# Patient Record
Sex: Male | Born: 1935 | Race: White | Hispanic: No | Marital: Married | Smoking: Never smoker
Health system: Southern US, Community
[De-identification: ages and names within clinical notes are randomized; demographics above are authoritative.]

## PROBLEM LIST (undated history)

## (undated) DIAGNOSIS — I10 Essential (primary) hypertension: Secondary | ICD-10-CM

## (undated) DIAGNOSIS — E785 Hyperlipidemia, unspecified: Secondary | ICD-10-CM

## (undated) DIAGNOSIS — I509 Heart failure, unspecified: Secondary | ICD-10-CM

## (undated) DIAGNOSIS — N400 Enlarged prostate without lower urinary tract symptoms: Secondary | ICD-10-CM

## (undated) DIAGNOSIS — T8859XA Other complications of anesthesia, initial encounter: Secondary | ICD-10-CM

## (undated) HISTORY — PX: CORONARY ANGIOPLASTY WITH STENT PLACEMENT: SHX49

## (undated) HISTORY — PX: PROSTATE ABLATION: SHX6042

## (undated) HISTORY — PX: HERNIA REPAIR: SHX51

---

## 2013-05-02 DIAGNOSIS — N401 Enlarged prostate with lower urinary tract symptoms: Secondary | ICD-10-CM | POA: Insufficient documentation

## 2013-06-03 ENCOUNTER — Ambulatory Visit: Payer: Self-pay | Admitting: Urology

## 2013-06-03 LAB — BASIC METABOLIC PANEL
BUN: 17 mg/dL (ref 7–18)
Co2: 26 mmol/L (ref 21–32)
EGFR (Non-African Amer.): 48 — ABNORMAL LOW
Glucose: 93 mg/dL (ref 65–99)
Osmolality: 269 (ref 275–301)
Sodium: 134 mmol/L — ABNORMAL LOW (ref 136–145)

## 2013-06-03 LAB — CBC WITH DIFFERENTIAL/PLATELET
Comment - H1-Com1: NORMAL
Eosinophil: 3 %
HCT: 41.1 % (ref 40.0–52.0)
HGB: 14.7 g/dL (ref 13.0–18.0)
Lymphocytes: 12 %
Monocytes: 5 %
Platelet: 238 10*3/uL (ref 150–440)
RBC: 4.38 10*6/uL — ABNORMAL LOW (ref 4.40–5.90)
RDW: 13.9 % (ref 11.5–14.5)
Segmented Neutrophils: 80 %
WBC: 9.2 10*3/uL (ref 3.8–10.6)

## 2013-06-12 ENCOUNTER — Ambulatory Visit: Payer: Self-pay | Admitting: Urology

## 2013-06-13 LAB — HEMOGLOBIN: HGB: 13.4 g/dL (ref 13.0–18.0)

## 2013-07-12 DIAGNOSIS — R350 Frequency of micturition: Secondary | ICD-10-CM | POA: Insufficient documentation

## 2014-11-14 NOTE — Op Note (Signed)
PATIENT NAME:  James Mata, James Mata MR#:  [id number] DATE OF BIRTH:  10/13/1930  DATE OF PROCEDURE:  06/12/2013  PREOPERATIVE DIAGNOSES:  1.  Benign prostatic hypertrophy with lower urinary tract symptoms.  2.  Urinary retention secondary to above.   POSTOPERATIVE DIAGNOSES: 1.  Benign prostatic hypertrophy with lower urinary tract symptoms.  2.  Urinary retention secondary to above.   PROCEDURE: Photoselective vaporization of the prostate.   SURGEON: Irineo AxonScott Stoioff, MD   ASSISTANT: None.   ANESTHESIA: General.   INDICATIONS: This is an 81 year old male with acute urinary retention. He has failed voiding trials x2. Cystoscopy demonstrated lateral lobe enlargement without significant bladder neck elevation or median lobe. After discussion of options, he elected photoselective vaporization with a green light laser.  DESCRIPTION OF PROCEDURE: The patient was taken to the operating room and placed on the cystoscopy table in the supine position. A general anesthetic was administered by anesthesia. He was then placed in the low lithotomy position and his external genitalia were prepped and draped in the usual fashion. A timeout was performed per protocol with all in agreement. A 22.5-French continuous flow laser cystoscope was lubricated and passed under direct vision. The lateral lobes were touching and the anterior and posterior dimensions of the prostate were increased. There was no significant bladder neck elevation or median lobe tissue present. There were inflammatory changes of the trigone secondary to the indwelling Foley catheter. No papillary or solid appearing lesions were noted. A Greenlight XPS laser fiber was placed through the cystoscope. Attention was directed to the right lateral lobe and vaporization was commenced at a power of 80 watts beginning at the bladder neck and working toward the verumontanum. There was a moderate amount of oozing noted throughout the procedure necessitating  intermittent laser coagulation. Power was increased to 120 watts. Once right lateral lobe was vaporized, the oozing was impeding visualization. It was elected at this point to convert to electrosurgical vaporization. The cystoscope was removed. A 27-French continuous-flow resectoscope sheath with visual obturator was placed without difficulty. The Iglesias resectoscope with button electrode was then placed into the sheath. The bladder neck region and right lateral lobe area was contoured. The left lateral lobe was then vaporized from bladder neck to verumontanum. Hemostasis was obtained with cautery. At the completion of the procedure, there was a moderate amount of oozing present with no bleeders noted. This was impeding visualization and it was elected to terminate the procedure. Vaporization was not complete. A 20-French Foley catheter was placed with moderate bloody effluent upon irrigation. This catheter was removed and a 20-French three-way Foley catheter was placed to CBI. A B and O suppository was placed per rectum. He was taken to PAC-U in stable condition. EBL 100 mL. ____________________________ Verna CzechScott C. Lonna CobbStoioff, MD scs:sb D: 06/13/2013 08:08:23 ET T: 06/13/2013 08:25:28 ET JOB#: [id number]  cc: Lorin PicketScott C. Lonna CobbStoioff, MD, <Dictator> Riki AltesSCOTT C STOIOFF MD ELECTRONICALLY SIGNED 06/30/2013 17:09

## 2020-02-14 ENCOUNTER — Emergency Department
Admission: EM | Admit: 2020-02-14 | Discharge: 2020-02-14 | Disposition: A | Discharge status: Home / Self Care | Payer: Medicare PPO | Attending: Emergency Medicine | Admitting: Emergency Medicine

## 2020-02-14 ENCOUNTER — Other Ambulatory Visit: Payer: Self-pay

## 2020-02-14 ENCOUNTER — Telehealth: Payer: Self-pay | Admitting: Urology

## 2020-02-14 DIAGNOSIS — N390 Urinary tract infection, site not specified: Secondary | ICD-10-CM | POA: Diagnosis not present

## 2020-02-14 DIAGNOSIS — R339 Retention of urine, unspecified: Secondary | ICD-10-CM | POA: Diagnosis present

## 2020-02-14 DIAGNOSIS — I509 Heart failure, unspecified: Secondary | ICD-10-CM | POA: Insufficient documentation

## 2020-02-14 DIAGNOSIS — I110 Hypertensive heart disease with heart failure: Secondary | ICD-10-CM | POA: Insufficient documentation

## 2020-02-14 DIAGNOSIS — R338 Other retention of urine: Secondary | ICD-10-CM | POA: Diagnosis not present

## 2020-02-14 DIAGNOSIS — N320 Bladder-neck obstruction: Secondary | ICD-10-CM

## 2020-02-14 HISTORY — DX: Essential (primary) hypertension: I10

## 2020-02-14 HISTORY — DX: Benign prostatic hyperplasia without lower urinary tract symptoms: N40.0

## 2020-02-14 HISTORY — DX: Hyperlipidemia, unspecified: E78.5

## 2020-02-14 HISTORY — DX: Heart failure, unspecified: I50.9

## 2020-02-14 LAB — URINALYSIS, COMPLETE (UACMP) WITH MICROSCOPIC
Bacteria, UA: NONE SEEN
RBC / HPF: >50 RBC/hpf — ABNORMAL HIGH (ref 0–5)
Specific Gravity, Urine: 1.011 (ref 1.005–1.030)
Squamous Epithelial / LPF: NONE SEEN (ref 0–5)
WBC, UA: >50 WBC/hpf — ABNORMAL HIGH (ref 0–5)

## 2020-02-14 MED ORDER — HYDROCODONE-ACETAMINOPHEN 5-325 MG PO TABS: 2.0000 | ORAL_TABLET | Freq: Once | ORAL | Status: DC

## 2020-02-14 MED ORDER — CEPHALEXIN 500 MG PO CAPS
500.0000 mg | ORAL_CAPSULE | Freq: Once | ORAL | Status: AC
  Administered 2020-02-14: 500 mg via ORAL
  Filled 2020-02-14: qty 1

## 2020-02-14 NOTE — ED Notes (Signed)
Urologist at bedside.

## 2020-02-14 NOTE — Telephone Encounter (Signed)
-----   Message from Jerilee Field, MD sent at 02/14/2020  1:09 PM EDT ----- He needs f/u with Dr. Lonna Cobb or PA to plan foley catheter change in 1-2 weeks. S/p bladder neck dilation and foley placement in ED 7/23. Thanks.

## 2020-02-14 NOTE — Telephone Encounter (Signed)
App made 

## 2020-02-14 NOTE — ED Notes (Signed)
Dr Lenard Lance at bedside with a coude catheter. Unable to place, urology cart called for

## 2020-02-14 NOTE — ED Notes (Signed)
This RN attempted to place a foley without success, Dr Lenard Lance aware

## 2020-02-14 NOTE — Discharge Instructions (Addendum)
Indwelling Urinary Catheter Care, Adult An indwelling urinary catheter is a thin tube that is put into your bladder. The tube helps to drain pee (urine) out of your body. The tube goes in through your urethra. Your urethra is where pee comes out of your body. Your pee will come out through the catheter, then it will go into a bag (drainage bag). Take good care of your catheter so it will work well. How to wear your catheter and bag Supplies needed  Sticky tape (adhesive tape) or a leg strap.  Alcohol wipe or soap and water (if you use tape).  A clean towel (if you use tape).  Large overnight bag.  Smaller bag (leg bag). Wearing your catheter Attach your catheter to your leg with tape or a leg strap.  Make sure the catheter is not pulled tight.  If a leg strap gets wet, take it off and put on a dry strap.  If you use tape to hold the bag on your leg: 1. Use an alcohol wipe or soap and water to wash your skin where the tape made it sticky before. 2. Use a clean towel to pat-dry that skin. 3. Use new tape to make the bag stay on your leg. Wearing your bags You should have been given a large overnight bag.  You may wear the overnight bag in the day or night.  Always have the overnight bag lower than your bladder.  Do not let the bag touch the floor.  Before you go to sleep, put a clean plastic bag in a wastebasket. Then hang the overnight bag inside the wastebasket. You should also have a smaller leg bag that fits under your clothes.  Always wear the leg bag below your knee.  Do not wear your leg bag at night. How to care for your skin and catheter Supplies needed  A clean washcloth.  Water and mild soap.  A clean towel. Caring for your skin and catheter      Clean the skin around your catheter every day: 1. Wash your hands with soap and water. 2. Wet a clean washcloth in warm water and mild soap. 3. Clean the skin around your urethra.  If you are  male:  Gently spread the folds of skin around your vagina (labia).  With the washcloth in your other hand, wipe the inner side of your labia on each side. Wipe from front to back.  If you are male:  Pull back any skin that covers the end of your penis (foreskin).  With the washcloth in your other hand, wipe your penis in small circles. Start wiping at the tip of your penis, then move away from the catheter.  Move the foreskin back in place, if needed. 4. With your free hand, hold the catheter close to where it goes into your body.  Keep holding the catheter during cleaning so it does not get pulled out. 5. With the washcloth in your other hand, clean the catheter.  Only wipe downward on the catheter.  Do not wipe upward toward your body. Doing this may push germs into your urethra and cause infection. 6. Use a clean towel to pat-dry the catheter and the skin around it. Make sure to wipe off all soap. 7. Wash your hands with soap and water.  Shower every day. Do not take baths.  Do not use cream, ointment, or lotion on the area where the catheter goes into your body, unless your doctor tells you   to.  Do not use powders, sprays, or lotions on your genital area.  Check your skin around the catheter every day for signs of infection. Check for: ? Redness, swelling, or pain. ? Fluid or blood. ? Warmth. ? Pus or a bad smell. How to empty the bag Supplies needed  Rubbing alcohol.  Gauze pad or cotton ball.  Tape or a leg strap. Emptying the bag Pour the pee out of your bag when it is ?- full, or at least 2-3 times a day. Do this for your overnight bag and your leg bag. 1. Wash your hands with soap and water. 2. Separate (detach) the bag from your leg. 3. Hold the bag over the toilet or a clean pail. Keep the bag lower than your hips and bladder. This is so the pee (urine) does not go back into the tube. 4. Open the pour spout. It is at the bottom of the bag. 5. Empty the  pee into the toilet or pail. Do not let the pour spout touch any surface. 6. Put rubbing alcohol on a gauze pad or cotton ball. 7. Use the gauze pad or cotton ball to clean the pour spout. 8. Close the pour spout. 9. Attach the bag to your leg with tape or a leg strap. 10. Wash your hands with soap and water. Follow instructions for cleaning the drainage bag:  From the product maker.  As told by your doctor. How to change the bag Supplies needed  Alcohol wipes.  A clean bag.  Tape or a leg strap. Changing the bag Replace your bag when it starts to leak, smell bad, or look dirty. 1. Wash your hands with soap and water. 2. Separate the dirty bag from your leg. 3. Pinch the catheter with your fingers so that pee does not spill out. 4. Separate the catheter tube from the bag tube where these tubes connect (at the connection valve). Do not let the tubes touch any surface. 5. Clean the end of the catheter tube with an alcohol wipe. Use a different alcohol wipe to clean the end of the bag tube. 6. Connect the catheter tube to the tube of the clean bag. 7. Attach the clean bag to your leg with tape or a leg strap. Do not make the bag tight on your leg. 8. Wash your hands with soap and water. General rules   Never pull on your catheter. Never try to take it out. Doing that can hurt you.  Always wash your hands before and after you touch your catheter or bag. Use a mild, fragrance-free soap. If you do not have soap and water, use hand sanitizer.  Always make sure there are no twists or bends (kinks) in the catheter tube.  Always make sure there are no leaks in the catheter or bag.  Drink enough fluid to keep your pee pale yellow.  Do not take baths, swim, or use a hot tub.  If you are male, wipe from front to back after you poop (have a bowel movement). Contact a doctor if:  Your pee is cloudy.  Your pee smells worse than usual.  Your catheter gets clogged.  Your catheter  leaks.  Your bladder feels full. Get help right away if:  You have redness, swelling, or pain where the catheter goes into your body.  You have fluid, blood, pus, or a bad smell coming from the area where the catheter goes into your body.  Your skin feels warm where   the catheter goes into your body.  You have a fever.  You have pain in your: ? Belly (abdomen). ? Legs. ? Lower back. ? Bladder.  You see blood in the catheter.  Your pee is pink or red.  You feel sick to your stomach (nauseous).  You throw up (vomit).  You have chills.  Your pee is not draining into the bag.  Your catheter gets pulled out. Summary  An indwelling urinary catheter is a thin tube that is placed into the bladder to help drain pee (urine) out of the body.  The catheter is placed into the part of the body that drains pee from the bladder (urethra).  Taking good care of your catheter will keep it working properly and help prevent problems.  Always wash your hands before and after touching your catheter or bag.  Never pull on your catheter or try to take it out. This information is not intended to replace advice given to you by your health care provider. Make sure you discuss any questions you have with your health care provider. Document Revised: 11/02/2018 Document Reviewed: 02/24/2017 Elsevier Patient Education  2020 [employer]  

## 2020-02-14 NOTE — ED Triage Notes (Signed)
Pt states he saw his PCP yesterday for a UTI and started abx, states he has only been able to pass small amounts of urine since last night and feels like his bladder is full. States he has a hx of urinary retention.

## 2020-02-14 NOTE — ED Notes (Signed)
Pt arrives to the ER c/o being unable to void, states that it started last pm, states that his last foley he had placed was in 2014. Pt showed greater than a 1000 ml's on bladder scan. Pt reports that he was told he had 3 different bacteria in his urine yesterday

## 2020-02-14 NOTE — ED Notes (Signed)
Bladder scan results = ">999 ml".

## 2020-02-14 NOTE — ED Provider Notes (Signed)
[facility] Emergency Department Provider Note  Time seen: 10:14 AM  I have reviewed the triage vital signs and the nursing notes.   HISTORY  Chief Complaint Urinary Retention   HPI James Mata is a 86 y.o. male with a past medical history of CHF, BPH, hypertension, hyperlipidemia presents to the emergency department with inability to urinate.  According to the patient he was diagnosed with urinary tract infection yesterday his doctor and placed on antibiotics and given a shot of antibiotics per patient.  States he was doing well yesterday until overnight when he began urinating very little and then this morning is not been able to urinate and has had increasing distention and discomfort in the lower abdomen.  Patient denies any fever denies any nausea or vomiting.  Appears well currently.   Past Medical History:  Diagnosis Date  . CHF (congestive heart failure) (HCC)   . Enlarged prostate   . Hyperlipidemia   . Hypertension     There are no problems to display for this patient.   Prior to Admission medications   Not on File    No Known Allergies  No family history on file.  Social History Social History   Tobacco Use  . Smoking status: Never Smoker  . Smokeless tobacco: Never Used  Substance Use Topics  . Alcohol use: Not Currently  . Drug use: Not Currently    Review of Systems Constitutional: Negative for fever. Cardiovascular: Negative for chest pain. Respiratory: Negative for shortness of breath. Gastrointestinal: Positive for lower abdominal discomfort/fullness. Genitourinary: Inability to urinate this morning. Musculoskeletal: Negative for musculoskeletal complaints Neurological: Negative for headache All other ROS negative  ____________________________________________   PHYSICAL EXAM:  VITAL SIGNS: ED Triage Vitals  Enc Vitals Group     BP 02/14/20 0903 (!) 186/96     Pulse Rate 02/14/20 0903 101     Resp 02/14/20  0903 17     Temp 02/14/20 0903 98 F (36.7 C)     Temp Source 02/14/20 0903 Oral     SpO2 02/14/20 0903 95 %     Weight 02/14/20 0905 170 lb (77.1 kg)     Height 02/14/20 0905 5\' 7"  (1.702 m)     Head Circumference --      Peak Flow --      Pain Score 02/14/20 0905 7     Pain Loc --      Pain Edu? --      Excl. in GC? --    Constitutional: Alert and oriented. Well appearing and in no distress. Eyes: Normal exam ENT      Head: Normocephalic and atraumatic.      Mouth/Throat: Mucous membranes are moist. Cardiovascular: Normal rate, regular rhythm. No murmur Respiratory: Normal respiratory effort without tachypnea nor retractions. Breath sounds are clear  Gastrointestinal: Fullness to the lower abdomen consistent with bladder distention with mild tenderness to this area.  Abdomen otherwise benign. Musculoskeletal: Nontender with normal range of motion in all extremities.  Neurologic:  Normal speech and language. No gross focal neurologic deficits  Skin:  Skin is warm, dry and intact.  Psychiatric: Mood and affect are normal.   ____________________________________________    INITIAL IMPRESSION / ASSESSMENT AND PLAN / ED COURSE  Pertinent labs & imaging results that were available during my care of the patient were reviewed by me and considered in my medical decision making (see chart for details).   Patient presents to the emergency department for lower abdominal  discomfort/fullness along with inability to urinate.  Patient was diagnosed with urinary tract infection yesterday by his PCP and placed on antibiotics.  Has not been able to urinate this morning.  Patient has a distended lower abdomen.  Bladder scan shows greater than 1000 mL.  We will place a Foley catheter and send the urine for urinalysis and urine culture.  Unable to pass the catheter x2.  I used a coud catheter, unable to pass a coud catheter.  We will discussed with urology for further recommendations.  Urology  was able to pass a wire and dilate to pass a catheter.  Catheter is now in place and draining correctly.  Urology is checked on the patient approximate hour after catheter placement continues to drain properly.  Urinalysis does show blood and white cells.  Patient is currently taking antibiotics.  Patient will follow up with Dr. Lonna Cobb in the office.  I discussed return precautions.  Patient agreeable to plan of care.  James Mata was evaluated in Emergency Department on 02/14/2020 for the symptoms described in the history of present illness. He was evaluated in the context of the global COVID-19 pandemic, which necessitated consideration that the patient might be at risk for infection with the SARS-CoV-2 virus that causes COVID-19. Institutional protocols and algorithms that pertain to the evaluation of patients at risk for COVID-19 are in a state of rapid change based on information released by regulatory bodies including the CDC and federal and state organizations. These policies and algorithms were followed during the patient's care in the ED.  ____________________________________________   FINAL CLINICAL IMPRESSION(S) / ED DIAGNOSES  Urinary tract infection Urinary retention   Minna Antis, MD 02/14/20 1404

## 2020-02-14 NOTE — Consult Note (Signed)
Consultation: Urinary retention, difficult Foley Requested by: Dr. Minna Antis  History of Present Illness: [name] is an 86 year old male with a history of BPH.  He underwent greenlight photo vaporization of the prostate with Dr. Lonna Cobb in 2014.  He has had progressive difficulty voiding and weak stream over the past few months until today when he could no longer void.  Bladder scan greater than a liter.  Nurses tried to catheters and a coud catheter with no success.  Urology consulted.  Past Medical History:  Diagnosis Date  . CHF (congestive heart failure) (HCC)   . Enlarged prostate   . Hyperlipidemia   . Hypertension      Home Medications:  (Not in a hospital admission)  Allergies: No Known Allergies  No family history on file. Social History:  reports that he has never smoked. He has never used smokeless tobacco. He reports previous alcohol use. He reports previous drug use.  ROS: A complete review of systems was performed.  All systems are negative except for pertinent findings as noted. Review of Systems  All other systems reviewed and are negative.    Physical Exam:  Vital signs in last 24 hours: Temp:  [98 F (36.7 C)] 98 F (36.7 C) (07/23 0903) Pulse Rate:  [90-101] 90 (07/23 1200) Resp:  [17] 17 (07/23 0903) BP: (176-186)/(89-96) 176/89 (07/23 1200) SpO2:  [93 %-95 %] 93 % (07/23 1200) Weight:  [77.1 kg] 77.1 kg (07/23 0905) General:  Alert and oriented, No acute distress HEENT: Normocephalic, atraumatic Cardiovascular: Regular rate and rhythm Lungs: Regular rate and effort Abdomen: Soft, nontender, nondistended, no abdominal masses, umbilical hernia, distended bladder. Back: No CVA tenderness Extremities: + edema Neurologic: Grossly intact GU: Penis circumcised and without mass or lesion.  Glans and meatus appeared normal.  Blood per meatus present.   Procedure: I discussed with the patient the nature risk benefits of Foley catheter placement  and he elected to proceed.  He was prepped and draped in usual sterile fashion and I passed an 90 [place] coud catheter which coiled in the prostate.  No drainage.  I discussed with the patient the nature risk benefits of flexible cystoscopy which is similar to Foley catheter placement although we discussed he may need passage of a wire and dilation of any stricture.  All questions answered.  He elected to proceed.  The flexible cystoscope was passed per urethra and made it all the way past the veru into the prostate when there was a slightly dilated prostatic urethra where the catheters need To nephrectomy have been coiling.  I could follow clear mucosa anteriorly and followed that up had a steep angle toward the bladder and found a 8 [place] hole to pierce through which I believed looked into the bladder.  A sensor wire was advanced without difficulty and it felt the coil normally.  I then backed the scope out and use the urethral dilators.  Initially I passed a 12 which would not go and then switched down to the 8.  He was dilated from 8-12 [place].  The 14 French catheter then buckled but with some tension on the wire and then passed.  I passed the cystoscope along the wire but could not see well enough to follow the wire into the bladder or get the scope to pass into the bladder.  I then backed the scope out and then passed the 16 [place] dilator which would not pass.  Therefore I switched back to the 14 [place] and  was able to get it to pass.  I would add removing the wire allowed brisk drainage of clear urine confirming bladder access.  The wire was replaced and the catheter removed.  The smallest council tip catheter had was a 38 [place] and I tried to pass it and it would not pass.  Therefore I passed the 12 and the 14 back into the bladder.  I remove the wire again there was brisk drainage.  Swapped out to an Amplatz superstiff and then tried to repassed the 16 [place] dilator which still would not pass.  I  tried the 45 [place] council tip 1 more time and with some steady back pressure on the wire the 16 [place] council tip slipped into the bladder.  The balloon was inflated and then it was seated at the bladder neck and drained light red urine.  There were no clots.  Laboratory Data:  No results found for this or any previous visit (from the past 24 hour(s)). No results found for this or any previous visit (from the past 240 hour(s)). Creatinine: No results for input(s): CREATININE in the last 168 hours.  Impression/Assessment/plan:  Bladder neck contracture-continue Foley catheter.  I will have him follow-up with Dr. Lonna Cobb in a week or 2 to plan either catheter change in the office where cystoscopy is available or formal evaluation and dilation in the operating room.    James Mata 02/14/2020, 12:59 PM

## 2020-02-18 ENCOUNTER — Ambulatory Visit (INDEPENDENT_AMBULATORY_CARE_PROVIDER_SITE_OTHER): Payer: Medicare PPO | Admitting: Physician Assistant

## 2020-02-18 ENCOUNTER — Other Ambulatory Visit: Payer: Self-pay

## 2020-02-18 ENCOUNTER — Encounter: Payer: Self-pay | Admitting: Physician Assistant

## 2020-02-18 VITALS — BP 125/72 | HR 74 | Ht 67.0 in | Wt 170.0 lb

## 2020-02-18 DIAGNOSIS — R339 Retention of urine, unspecified: Secondary | ICD-10-CM | POA: Diagnosis not present

## 2020-02-18 DIAGNOSIS — T839XXA Unspecified complication of genitourinary prosthetic device, implant and graft, initial encounter: Secondary | ICD-10-CM

## 2020-02-18 LAB — BLADDER SCAN AMB NON-IMAGING

## 2020-02-18 NOTE — Progress Notes (Signed)
02/18/2020 4:57 PM   James Mata November 02, 1930 [id number]  CC: Chief Complaint  Patient presents with   Other    HPI: James Mata is a 86 y.o. male with PMH BPH s/p greenlight photo vaporization of the prostate with Dr. Lonna Cobb in 2014 with a recent history of urinary tract infection with subsequent urinary retention requiring Foley catheter placement in the emergency department with Dr. Mena Goes 4 days ago.  Dr. Mena Goes noted a bladder neck contracture requiring bladder neck dilation at the time of Foley placement.  He presents today as a same-day add-on for nondraining Foley catheter.  He reports gross hematuria yesterday with progressively decreased urinary output over the past day.  He has had a distended abdomen today and generalized abdominal discomfort.  He takes a baby aspirin daily, no other blood thinners.  Bladder scan on arrival reads .  PMH: Past Medical History:  Diagnosis Date   CHF (congestive heart failure) (HCC)    Enlarged prostate    Hyperlipidemia    Hypertension     Surgical History: Past Surgical History:  Procedure Laterality Date   CORONARY ANGIOPLASTY WITH STENT PLACEMENT     HERNIA REPAIR     PROSTATE ABLATION      Home Medications:  Allergies as of 02/18/2020   No Known Allergies     Medication List       Accurate as of February 18, 2020  4:57 PM. If you have any questions, ask your nurse or doctor.        amoxicillin 500 MG tablet Commonly known as: AMOXIL Take 500 mg by mouth 3 (three) times daily.   aspirin 81 MG EC tablet Take by mouth.   cyclobenzaprine 5 MG tablet Commonly known as: FLEXERIL Take 5 mg by mouth 2 (two) times daily as needed.   doxazosin 4 MG tablet Commonly known as: CARDURA Take 4 mg by mouth daily.   furosemide 80 MG tablet Commonly known as: LASIX Take 80 mg by mouth 2 (two) times daily.   isosorbide mononitrate 60 MG 24 hr tablet Commonly known as: IMDUR Take by mouth.    metoprolol succinate 25 MG 24 hr tablet Commonly known as: TOPROL-XL Take 12.5 mg by mouth daily.   nitroGLYCERIN 0.4 MG SL tablet Commonly known as: NITROSTAT SMARTSIG:Tablet(s) Sublingual As Directed   potassium chloride SA 20 MEQ tablet Commonly known as: KLOR-CON   tamsulosin 0.4 MG Caps capsule Commonly known as: FLOMAX Take 0.4 mg by mouth daily.   traMADol 50 MG tablet Commonly known as: ULTRAM Take 50 mg by mouth 2 (two) times daily as needed.      Allergies:  No Known Allergies  Family History: History reviewed. No pertinent family history.  Social History:   reports that he has never smoked. He has never used smokeless tobacco. He reports previous alcohol use. He reports previous drug use.  Physical Exam: BP 125/72 (BP Location: Left Arm, Patient Position: Sitting, Cuff Size: Normal)    Pulse 74    Ht 5\' 7"  (1.702 m)    Wt 170 lb (77.1 kg)    BMI 26.63 kg/m   Constitutional:  Alert and oriented, no acute distress, nontoxic appearing HEENT: [place], AT Cardiovascular: No clubbing, cyanosis, or edema Respiratory: Normal respiratory effort, no increased work of breathing GU: Foley catheter in place with dry drainage tubing and scant volume of gross hematuria in his night bag. Skin: No rashes, bruises or suspicious lesions Neurologic: Grossly intact, no focal deficits, moving  all 4 extremities Psychiatric: Normal mood and affect  Laboratory Data: Results for orders placed or performed in visit on 02/18/20  BLADDER SCAN AMB NON-IMAGING  Result Value Ref Range   Scan Result    Assessment & Plan:   1. Foley catheter problem, initial encounter [facility]) Bladder scan significantly elevated with nondraining Foley catheter in place.  Due to concerns for possible clot retention, I elected to irrigate the patient's catheter in place.  I instilled 60 cc of sterile water into the 16 French catheter in place and was immediately able to evacuate a small clot from the  patient's bladder.  Foley catheter immediately began draining.  Ultimately, I drained approximately 1000 mL of yellow urine from the bladder.  Patient's abdominal distention resolved.  I subsequently irrigated the patient's bladder with 180 mL of sterile water with clearance of scant clot threads.  Urine remained clear with no evidence of active bleeding.  Counseled patient to follow-up with return of nondraining Foley catheter.  Otherwise, will proceed with plans for voiding trial with me next week.  He expressed understanding. - BLADDER SCAN AMB NON-IMAGING   Return if symptoms worsen or fail to improve.  Carman Ching, PA-C  [facility] [address], [address] [place], [place] [address] [phone]

## 2020-02-19 ENCOUNTER — Ambulatory Visit (INDEPENDENT_AMBULATORY_CARE_PROVIDER_SITE_OTHER): Payer: Medicare PPO | Admitting: Physician Assistant

## 2020-02-19 DIAGNOSIS — N3289 Other specified disorders of bladder: Secondary | ICD-10-CM

## 2020-02-19 NOTE — Progress Notes (Signed)
Bladder Irrigation  Patient is present today for a bladder irrigation. Patient states that his catheter was not draining last night per after hours nurse line call. Patient was contacted this morning and scheduled for a bladder irrigation.  Urine was noted in the bag upon arrival. Patient states he has been having the urge to urinate but cannot go. I was explained that this is called a bladder spasm which is common with a catheter in place. Patient was cleaned and prepped in a sterile fashion. 60 ml of saline/sterile water was instilled and irrigated into the bladder with a 50ml [place] syringe through the catheter in place.  After irrigation urine flow was noted no complications were noted catheter is now draining fine.  Catheter was reattached to the night bag for drainage. Patient tolerated well.   Preformed by: Eligha Bridegroom, CMA  Additional notes/ Follow up:  Keep follow up as scheduled

## 2020-02-24 ENCOUNTER — Ambulatory Visit (INDEPENDENT_AMBULATORY_CARE_PROVIDER_SITE_OTHER): Payer: Medicare PPO | Admitting: Physician Assistant

## 2020-02-24 ENCOUNTER — Encounter: Payer: Self-pay | Admitting: Physician Assistant

## 2020-02-24 ENCOUNTER — Other Ambulatory Visit: Payer: Self-pay

## 2020-02-24 ENCOUNTER — Ambulatory Visit: Payer: Self-pay | Admitting: Physician Assistant

## 2020-02-24 VITALS — BP 101/64 | HR 75 | Ht 67.0 in | Wt 170.0 lb

## 2020-02-24 DIAGNOSIS — N320 Bladder-neck obstruction: Secondary | ICD-10-CM

## 2020-02-24 NOTE — Progress Notes (Signed)
Patient presented to clinic today to discuss possible Foley exchange.  He is s/p greenlight photo vaporization of the prostate with Dr. Lonna Cobb in 2014 and presented to the ED on 02/14/2020 and urinary retention.  He was seen by Dr. Mena Goes, who noted bladder neck contracture requiring dilation and Foley placement over guidewire in the ED. notably, he had been diagnosed with UTI by his PCP and started on Augmentin the day prior to presenting to the ED.  Outside medical records not available for review.   Today, patient reports he has completed his antibiotics and he is feeling better.  In consultation with Dr. Lonna Cobb, I offered him office cystoscopy with catheter exchange with Dr. Lonna Cobb for further evaluation of his bladder neck contracture versus cystoscopy under anesthesia with bladder neck dilation.  Patient and family member agreed with office cystoscopy.  We will schedule this for tomorrow.

## 2020-02-25 ENCOUNTER — Other Ambulatory Visit: Payer: Self-pay | Admitting: Urology

## 2020-02-26 ENCOUNTER — Other Ambulatory Visit: Payer: Self-pay

## 2020-02-26 ENCOUNTER — Encounter: Payer: Self-pay | Admitting: Urology

## 2020-02-26 ENCOUNTER — Ambulatory Visit (INDEPENDENT_AMBULATORY_CARE_PROVIDER_SITE_OTHER): Payer: Medicare PPO | Admitting: Urology

## 2020-02-26 VITALS — BP 114/63 | HR 74 | Ht 71.0 in | Wt 170.0 lb

## 2020-02-26 DIAGNOSIS — N320 Bladder-neck obstruction: Secondary | ICD-10-CM | POA: Diagnosis not present

## 2020-02-28 ENCOUNTER — Encounter: Payer: Self-pay | Admitting: Urology

## 2020-02-28 NOTE — Progress Notes (Signed)
   02/28/20  CC:  Chief Complaint  Patient presents with  . Cysto    HPI: 86 y.o. male presents for follow-up of urinary retention.  He presented to the ED 02/14/2020 and urinary retention and inability to place a Foley catheter.  He underwent PVP by me in 2014.  He underwent cystoscopy with dilation and catheter placement by Dr. Mena Goes with findings of an 8 French stricture versus bladder neck contracture.  He was able to place a 16 French [place] catheter.  He presented today for catheter exchange possible cystoscopy however catheter was removed prior to exchange.  Blood pressure 114/63, pulse 74, height 5\' 11"  (1.803 m), weight 170 lb (77.1 kg). NED. A&Ox3.   No respiratory distress   Abd soft, NT, ND Normal phallus with bilateral descended testicles  Cystoscopy Procedure Note  Patient identification was confirmed, informed consent was obtained, and patient was prepped using Betadine solution.  Lidocaine jelly was administered per urethral meatus.     Pre-Procedure: - Inspection reveals a normal caliber urethral meatus.  Procedure: The flexible cystoscope was introduced without difficulty - No urethral strictures/lesions are present. - The cystoscope easily passed into the bladder; some adenoma regrowth distally  - Mild elevation and bladder neck was open bladder neck - Bilateral ureteral orifices identified - Bladder mucosa  reveals erythema at base secondary to indwelling Foley.  No solid or papillary lesions seen - No bladder stones -Moderate rabeculation  Retroflexion shows no intravesical median lobe  A 20 coud catheter was placed without difficulty  Post-Procedure: - Patient tolerated the procedure well  Assessment/ Plan:  Bladder neck was open and most likely a mid prostatic stricture with good dilation  We will leave his Foley catheter indwelling for an additional week then removed  Follow-up cystoscopy 6 weeks   [place], MD

## 2020-03-04 ENCOUNTER — Ambulatory Visit: Payer: Medicare PPO | Admitting: Physician Assistant

## 2020-03-04 ENCOUNTER — Encounter: Payer: Self-pay | Admitting: Physician Assistant

## 2020-03-04 ENCOUNTER — Other Ambulatory Visit: Payer: Self-pay

## 2020-03-04 VITALS — BP 127/67 | HR 57 | Ht 67.0 in | Wt 170.0 lb

## 2020-03-04 DIAGNOSIS — N99114 Postprocedural urethral stricture, male, unspecified: Secondary | ICD-10-CM

## 2020-03-04 LAB — BLADDER SCAN AMB NON-IMAGING

## 2020-03-04 NOTE — Progress Notes (Signed)
Simple Catheter Placement  Due to urinary retention patient is present today for a foley cath placement.  Foreskin was retracted, patient was cleaned and prepped in a sterile fashion with betadine, and 2% lidocaine jelly was instilled into the urethra. A 16 FR coude foley catheter was inserted, urine return was noted  , urine was yellow in color.  The balloon was filled with 10cc of sterile water.  A night bag was attached for drainage and the foreskin was reduced. Patient declined additional leg bags. Patient was given instruction on proper catheter care.  Patient tolerated well, no complications were noted   Performed by: Carman Ching, PA-C

## 2020-03-04 NOTE — Progress Notes (Signed)
03/04/2020 1:57 PM   James Mata 1931/07/04 [id number]  CC: Chief Complaint  Patient presents with  . Urinary Retention    TOV    HPI: James Mata is a 86 y.o. male with PMH BPH s/p greenlight photo vaporization of the prostate with Dr. Lonna Cobb in 2014 with a recent history of prostatic urethral stricture versus bladder neck contracture with associated urinary tract infection and urinary retention requiring Foley placement and dilation.  He underwent cystoscopy with Dr. Lonna Cobb on 02/26/2020 with findings of an open bladder neck and resolution of prior 8 French stricture.  A 20 French coud catheter was replaced at that time with plans to keep it indwelling for an additional week for passive dilation.  He presents today for Foley catheter removal.  No acute concerns today.  PMH: Past Medical History:  Diagnosis Date  . CHF (congestive heart failure) (HCC)   . Enlarged prostate   . Hyperlipidemia   . Hypertension     Surgical History: Past Surgical History:  Procedure Laterality Date  . CORONARY ANGIOPLASTY WITH STENT PLACEMENT    . HERNIA REPAIR    . PROSTATE ABLATION      Home Medications:  Allergies as of 03/04/2020   No Known Allergies     Medication List       Accurate as of March 04, 2020  1:57 PM. If you have any questions, ask your nurse or doctor.        STOP taking these medications   amoxicillin 500 MG tablet Commonly known as: AMOXIL Stopped by: Carman Ching, PA-C     TAKE these medications   aspirin 81 MG EC tablet Take by mouth.   cyclobenzaprine 5 MG tablet Commonly known as: FLEXERIL Take 5 mg by mouth 2 (two) times daily as needed.   doxazosin 4 MG tablet Commonly known as: CARDURA Take 4 mg by mouth daily.   furosemide 80 MG tablet Commonly known as: LASIX Take 80 mg by mouth 2 (two) times daily.   isosorbide mononitrate 60 MG 24 hr tablet Commonly known as: IMDUR Take by mouth.   metoprolol succinate 25 MG 24  hr tablet Commonly known as: TOPROL-XL Take 12.5 mg by mouth daily.   nitroGLYCERIN 0.4 MG SL tablet Commonly known as: NITROSTAT SMARTSIG:Tablet(s) Sublingual As Directed   potassium chloride SA 20 MEQ tablet Commonly known as: KLOR-CON   tamsulosin 0.4 MG Caps capsule Commonly known as: FLOMAX Take 0.4 mg by mouth daily.   traMADol 50 MG tablet Commonly known as: ULTRAM Take 50 mg by mouth 2 (two) times daily as needed.      Allergies:  No Known Allergies  Family History: No family history on file.  Social History:   reports that he has never smoked. He has never used smokeless tobacco. He reports previous alcohol use. He reports previous drug use.  Physical Exam: BP 127/67   Pulse (!) 57   Ht 5\' 7"  (1.702 m)   Wt 170 lb (77.1 kg)   BMI 26.63 kg/m   Constitutional:  Alert and oriented, no acute distress, nontoxic appearing HEENT: [place], AT Cardiovascular: No clubbing, cyanosis, or edema Respiratory: Normal respiratory effort, no increased work of breathing GU: Uncircumcised penis Skin: No rashes, bruises or suspicious lesions Neurologic: Grossly intact, no focal deficits, moving all 4 extremities Psychiatric: Normal mood and affect  Laboratory Data: Results for orders placed or performed in visit on 03/04/20  BLADDER SCAN AMB NON-IMAGING  Result Value Ref Range  Scan Result    Assessment & Plan:   1. 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 Patient was counseled to go home, push fluids, and avoid as needed with plans for repeat PVR in the afternoon.  Patient returned to clinic this afternoon for repeat PVR. He reports drinking approximately 28oz of fluid. He has been able to void a total of . PVR .  Voiding trial failed. Offered patient CIC teaching versus Foley replacement with repeat voiding trial in 2 weeks. He elected for Foley replacement, see  separate procedure note for details. - BLADDER SCAN AMB NON-IMAGING   Return in about 2 weeks (around 03/18/2020) for Voiding trial.  Carman Ching, PA-C  [facility] [address] [place], [place] [address] [phone]

## 2020-03-04 NOTE — Patient Instructions (Signed)

## 2020-03-04 NOTE — Progress Notes (Signed)
Fill and Pull Catheter Removal  Patient is present today for a catheter removal.  Patient was cleaned and prepped in a sterile fashion of sterile saline was instilled into the bladder when the patient felt the urge to urinate. 58ml of water was then drained from the balloon.  A 20FR coude silicone foley cath was removed from the bladder no complications were noted .  Patient was then given some time to void on their own.  Patient can void  43ml on their own after some time.  Patient tolerated well.  Performed by: Carman Ching, PA-C

## 2020-03-17 ENCOUNTER — Other Ambulatory Visit: Payer: Self-pay

## 2020-03-17 ENCOUNTER — Ambulatory Visit: Payer: Medicare PPO | Admitting: Physician Assistant

## 2020-03-17 VITALS — BP 131/82 | HR 92 | Ht 67.0 in | Wt 168.0 lb

## 2020-03-17 DIAGNOSIS — R339 Retention of urine, unspecified: Secondary | ICD-10-CM

## 2020-03-17 LAB — BLADDER SCAN AMB NON-IMAGING

## 2020-03-17 NOTE — Progress Notes (Signed)
Simple Catheter Placement  Due to urinary retention patient is present today for a foley cath placement.  Patient was cleaned and prepped in a sterile fashion with betadine and 2% lidocaine jelly was instilled into the urethra. A 14 FR coude silicone foley catheter was inserted, urine return was noted  , urine was amber in color.  The balloon was filled with 10cc of sterile water.  A night bag was attached for drainage. Patient was also given a leg bag to take home and was given instruction on how to change from one bag to another.  Patient was given instruction on proper catheter care.  Patient tolerated well, no complications were noted   Performed by: Carman Ching, PA-C

## 2020-03-17 NOTE — Progress Notes (Signed)
03/17/2020 3:09 PM   James Mata 10-08-1930 [id number]  CC: Chief Complaint  Patient presents with  . Follow-up    HPI: James Mata is a 86 y.o. male with PMH BPH s/p greenlight photo vaporization of the prostate with Dr. Lonna Cobb in 2014 with a recent history of urinary retention secondary to prostatic urethral stricture versus bladder neck contracture requiring Foley placement and dilation. He underwent cystoscopy with Dr. Lonna Cobb on 02/26/2020 with findings of an open bladder neck and resolution of prior 8 French stricture. He subsequently failed his first voiding trial with me on 03/04/2020. He presents today for second outpatient voiding trial.  Today, he reports tolerating his Foley catheter well. He states it has been helping him manage urinary frequency that he attributes to Lasix. No acute concerns.   PMH: Past Medical History:  Diagnosis Date  . CHF (congestive heart failure) (HCC)   . Enlarged prostate   . Hyperlipidemia   . Hypertension     Surgical History: Past Surgical History:  Procedure Laterality Date  . CORONARY ANGIOPLASTY WITH STENT PLACEMENT    . HERNIA REPAIR    . PROSTATE ABLATION      Home Medications:  Allergies as of 03/17/2020   No Known Allergies     Medication List       Accurate as of March 17, 2020  3:09 PM. If you have any questions, ask your nurse or doctor.        aspirin 81 MG EC tablet Take by mouth.   cyclobenzaprine 5 MG tablet Commonly known as: FLEXERIL Take 5 mg by mouth 2 (two) times daily as needed.   doxazosin 4 MG tablet Commonly known as: CARDURA Take 4 mg by mouth daily.   furosemide 80 MG tablet Commonly known as: LASIX Take 80 mg by mouth 2 (two) times daily.   isosorbide mononitrate 60 MG 24 hr tablet Commonly known as: IMDUR Take by mouth.   metoprolol succinate 25 MG 24 hr tablet Commonly known as: TOPROL-XL Take 12.5 mg by mouth daily.   nitroGLYCERIN 0.4 MG SL tablet Commonly known  as: NITROSTAT SMARTSIG:Tablet(s) Sublingual As Directed   potassium chloride SA 20 MEQ tablet Commonly known as: KLOR-CON   tamsulosin 0.4 MG Caps capsule Commonly known as: FLOMAX Take 0.4 mg by mouth daily.   traMADol 50 MG tablet Commonly known as: ULTRAM Take 50 mg by mouth 2 (two) times daily as needed.       Allergies:  No Known Allergies  Family History: No family history on file.  Social History:   reports that he has never smoked. He has never used smokeless tobacco. He reports previous alcohol use. He reports previous drug use.  Physical Exam: BP 131/82 (BP Location: Left Arm, Patient Position: Sitting, Cuff Size: Normal)   Pulse 92   Ht 5\' 7"  (1.702 m)   Wt 168 lb (76.2 kg)   BMI 26.31 kg/m   Constitutional:  Alert and oriented, no acute distress, nontoxic appearing HEENT: [place], AT Cardiovascular: No clubbing, cyanosis, or edema Respiratory: Normal respiratory effort, no increased work of breathing Skin: No rashes, bruises or suspicious lesions Neurologic: Grossly intact, no focal deficits, moving all 4 extremities Psychiatric: Normal mood and affect  Laboratory Data: Results for orders placed or performed in visit on 03/17/20  BLADDER SCAN AMB NON-IMAGING  Result Value Ref Range   Scan Result 03/19/20    Assessment & Plan:   1. Urinary retention Fill and pull voiding trial completed  in the morning, see separate procedure note for details.  Patient returned in the afternoon with elevated bladder scan of 421 mL.  He reported occasional sensations of the urge to urinate but has been unable to do so.  I had a lengthy conversation with the patient and his family member in clinic today.  I explained that given his recent cystoscopy findings, his recurrent urinary retention is unlikely to be obstructive in nature and is likely secondary to bladder dysfunction.  I offered him CIC teaching versus Foley catheter replacement.  Patient wishes to proceed with chronic  indwelling Foley.  I counseled the patient that a chronic indwelling Foley will require monthly catheter changes in our clinic.  I explained that a chronic Foley increases the risk of urinary infection over time and we reviewed signs and symptoms of urinary infection with a Foley catheter in place including lower abdominal pain, low back pain, fever, chills, nausea, vomiting, and acute mental status changes.  Patient expressed understanding.  Foley catheter replaced in clinic today, see separate procedure note for details.  I initially attempted to place a 53 [place] coud catheter, however had difficulty advancing the catheter into the bladder and no urinary return was noted.  Due to concern for possible worsening/recurrence of his urethral stricture versus bladder neck contracture, I attempted placement of a 14 French coud silicone catheter, which advanced into the bladder without difficulty.  If his stricture versus contracture continues to worsen, he may benefit from suprapubic catheterization in the future. - BLADDER SCAN AMB NON-IMAGING  Return in about 4 weeks (around 04/14/2020) for Catheter exchange.  Carman Ching, PA-C  [facility] [address] [place], [place] [address] [phone]

## 2020-03-17 NOTE — Progress Notes (Signed)
Fill and Pull Catheter Removal  Patient is present today for a catheter removal.  Patient was cleaned and prepped in a sterile fashion of sterile water was instilled into the bladder when the patient felt the urge to urinate. 73ml of water was then drained from the balloon.  A 16FR coude foley cath was removed from the bladder no complications were noted .  Patient was then given some time to void on their own.  Patient can void  on their own after some time.  Patient tolerated well.  Performed by: Carman Ching, PA-C   Follow up/ Additional notes: Push fluids and RTC this afternoon for PVR.

## 2020-03-24 ENCOUNTER — Telehealth: Payer: Self-pay

## 2020-03-24 NOTE — Telephone Encounter (Signed)
Incoming call from pt's daughter who states that pt has just wet his pants around his catheter. Daughter states that some urine is still draining into the catheter bag. Pt denies pain, hematuria, nausea, and vomiting. Advised daughter that the patient likely had bladder spasm which is not uncommon with long term foley placement. Advised she call back if symptoms worsen. Daughter gave verbal understanding.

## 2020-04-01 ENCOUNTER — Other Ambulatory Visit: Payer: Self-pay

## 2020-04-01 ENCOUNTER — Ambulatory Visit (INDEPENDENT_AMBULATORY_CARE_PROVIDER_SITE_OTHER): Payer: Medicare PPO | Admitting: Physician Assistant

## 2020-04-01 ENCOUNTER — Telehealth: Payer: Self-pay | Admitting: Physician Assistant

## 2020-04-01 ENCOUNTER — Encounter: Payer: Self-pay | Admitting: Physician Assistant

## 2020-04-01 VITALS — BP 105/66 | HR 60 | Ht 67.0 in | Wt 165.0 lb

## 2020-04-01 DIAGNOSIS — N3289 Other specified disorders of bladder: Secondary | ICD-10-CM

## 2020-04-01 MED ORDER — MIRABEGRON ER 25 MG PO TB24: 25.0000 mg | ORAL_TABLET | Freq: Every day | ORAL | 0 refills | Status: DC

## 2020-04-01 NOTE — Telephone Encounter (Signed)
Patient's daughter, Daine Floras, called the office twice this afternoon with confusion about the patient's medications.  They are not clear as to what medications they are to stop and which medications are to start taking.  I read the note that the patient should stop taking Flomax and begin taking Myrbetriq.  They want to know if the patient should continue to take the Doxazosin.  They can be reached at the daughter's number [phone].

## 2020-04-01 NOTE — Patient Instructions (Signed)
Stop Flomax (tamsulosin).

## 2020-04-01 NOTE — Progress Notes (Signed)
04/01/2020 1:54 PM   James Mata Oct 08, 1930 [id number]  CC: Chief Complaint  Patient presents with  . Cath Problems    HPI: James Mata is a 86 y.o. 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  Today patient reports urinary leakage around his catheter tubing associated with the sensation of urinary urgency. He denies pain with this. Urine is still draining into his night bag. He denies gross hematuria.  He continues to take both Flomax and Cardura. He has taken Cardura long term, prescribed by another provider.   PMH: Past Medical History:  Diagnosis Date  . CHF (congestive heart failure) (HCC)   . Enlarged prostate   . Hyperlipidemia   . Hypertension     Surgical History: Past Surgical History:  Procedure Laterality Date  . CORONARY ANGIOPLASTY WITH STENT PLACEMENT    . HERNIA REPAIR    . PROSTATE ABLATION      Home Medications:  Allergies as of 04/01/2020   No Known Allergies     Medication List       Accurate as of April 01, 2020  1:54 PM. If you have any questions, ask your nurse or doctor.        STOP taking these medications   tamsulosin 0.4 MG Caps capsule Commonly known as: FLOMAX Stopped by: Carman Ching, PA-C     TAKE these medications   aspirin 81 MG EC tablet Take by mouth.   cyclobenzaprine 5 MG tablet Commonly known as: FLEXERIL Take 5 mg by mouth 2 (two) times daily as needed.   doxazosin 4 MG tablet Commonly known as: CARDURA Take 4 mg by mouth daily.   furosemide 80 MG tablet Commonly known as: LASIX Take 80 mg by mouth 2 (two) times daily.   isosorbide mononitrate 60 MG 24 hr tablet Commonly known as: IMDUR Take by mouth.   metoprolol succinate 25 MG 24 hr  tablet Commonly known as: TOPROL-XL Take 12.5 mg by mouth daily.   mirabegron ER 25 MG Tb24 tablet Commonly known as: MYRBETRIQ Take 1 tablet (25 mg total) by mouth daily. Started by: Carman Ching, PA-C   nitroGLYCERIN 0.4 MG SL tablet Commonly known as: NITROSTAT SMARTSIG:Tablet(s) Sublingual As Directed   potassium chloride SA 20 MEQ tablet Commonly known as: KLOR-CON   traMADol 50 MG tablet Commonly known as: ULTRAM Take 50 mg by mouth 2 (two) times daily as needed.       Allergies:  No Known Allergies  Family History: No family history on file.  Social History:   reports that he has never smoked. He has never used smokeless tobacco. He reports previous alcohol use. He reports previous drug use.  Physical Exam: BP 105/66   Pulse 60   Ht 5\' 7"  (1.702 m)   Wt 165 lb (74.8 kg)   BMI 25.84 kg/m   Constitutional:  Alert, no acute distress, nontoxic appearing HEENT: [place], AT Cardiovascular: No clubbing, cyanosis, or edema Respiratory: Normal respiratory effort, no increased work of breathing GU: Foley catheter in place draining clear yellow urine Skin: No rashes, bruises or suspicious lesions Neurologic: Grossly intact, no focal deficits, moving all 4 extremities Psychiatric: Normal mood and affect  Assessment & Plan:   1. Bladder spasm Counseled patient that his symptoms are consistent with bladder spasms and  that this is a normal finding with indwelling Foleys. Explained that as long as the catheter is draining into his bag, as his appears to be, this is reassuring for catheter malfunction or occlusion. Recommend starting Myrbetriq 25mg  daily for symptom management; anticholinergics contraindicated due to age. Patient is in agreement with this plan; samples provided today. Will recheck symptoms in 2 weeks at his next scheduled catheter exchange.  Counseled patient to stop Flomax and follow up with PCP re: continuing Cardura. - mirabegron ER (MYRBETRIQ) 25 MG  TB24 tablet; Take 1 tablet (25 mg total) by mouth daily.  Dispense: 28 tablet; Refill: 0   Return for As scheduled.  , PA-C  [facility] [address], [address] [place], [place] [phone]

## 2020-04-01 NOTE — Telephone Encounter (Signed)
I just returned Ms. James Mata' call. I reiterated that I want James Mata to stop tamsulosin and continue doxazosin because he has been on doxazosin long-term per his PCP. I explained that I suspect he is on doxazosin for combined BPH and antihypertensive reasons and that it would be inappropriate for me to stop this medication as I did not prescribe it and I do not manage his blood pressure. I encouraged them to follow up with James Mata PCP to see if he should switch from doxazosin to an alternative antihypertensive now that he has a chronic indwelling Foley. She expressed understanding.

## 2020-04-02 ENCOUNTER — Telehealth: Payer: Self-pay

## 2020-04-02 NOTE — Telephone Encounter (Signed)
Patient's daughter called stating that PCP needs a fax for clarification on discontinuing the doxazosin. Dr. Gregery Na office was contacted unable to speak with clinical staff, Left a detailed vmail for clarification and telephone note was also faxed to [phone]

## 2020-04-03 NOTE — Telephone Encounter (Signed)
Incoming call from [place] at Dr. Gregery Na office stating that patient should remain on Doxazosin in order to prevent BP from rising. Called pt's daughter and advised her of this information. Daughter gave verbal understanding. They will d/c Flomax.

## 2020-04-14 ENCOUNTER — Ambulatory Visit (INDEPENDENT_AMBULATORY_CARE_PROVIDER_SITE_OTHER): Payer: Medicare PPO | Admitting: Physician Assistant

## 2020-04-14 ENCOUNTER — Encounter: Payer: Self-pay | Admitting: Physician Assistant

## 2020-04-14 ENCOUNTER — Other Ambulatory Visit: Payer: Self-pay | Admitting: Physician Assistant

## 2020-04-14 ENCOUNTER — Other Ambulatory Visit: Payer: Self-pay

## 2020-04-14 VITALS — BP 125/79 | HR 76 | Ht 67.0 in | Wt 160.0 lb

## 2020-04-14 DIAGNOSIS — N3289 Other specified disorders of bladder: Secondary | ICD-10-CM

## 2020-04-14 DIAGNOSIS — R339 Retention of urine, unspecified: Secondary | ICD-10-CM

## 2020-04-14 MED ORDER — TROSPIUM CHLORIDE 20 MG PO TABS: 20.0000 mg | ORAL_TABLET | Freq: Every day | ORAL | 2 refills | Status: DC

## 2020-04-14 NOTE — Progress Notes (Signed)
Cath Change/ Replacement  Patient is present today for a catheter change due to urinary retention.  63ml of water was removed from the balloon, a 14FR coude silicone foley cath was removed without difficulty.  Patient was cleaned and prepped in a sterile fashion with betadine and 2% lidocaine jelly was instilled in the urethra. A 14 FR coude silicone foley cath was replaced into the bladder no complications were noted Urine return was noted 86ml and urine was yellow in color. The balloon was filled with 67ml of sterile water. A night bag was attached for drainage.    Performed by: Carman Ching, PA-C   Follow up: Return in about 4 weeks (around 05/12/2020) for Catheter exchange.   Additional notes: Myrbetriq cost prohibitive, though effective in reducing bladder spasms. Patient reports no pain with spasms. Offered trial of anticholinergics, explained side effects of constipation, dry mouth, dry eye, and link to dementia with long-term use. Patient wishes to try trospium; rx sent.

## 2020-04-15 ENCOUNTER — Ambulatory Visit (INDEPENDENT_AMBULATORY_CARE_PROVIDER_SITE_OTHER): Payer: Medicare PPO | Admitting: Physician Assistant

## 2020-04-15 DIAGNOSIS — N3289 Other specified disorders of bladder: Secondary | ICD-10-CM | POA: Diagnosis not present

## 2020-04-15 LAB — BLADDER SCAN AMB NON-IMAGING

## 2020-04-15 MED ORDER — TROSPIUM CHLORIDE 20 MG PO TABS: 20.0000 mg | ORAL_TABLET | Freq: Every day | ORAL | 2 refills | Status: DC

## 2020-04-15 NOTE — Progress Notes (Signed)
Patient present today complaining of bladder spasms. Urine is noted in bag and is draining. PVR shows balloon in place minimal urine in bladder 2ml. Urine in bag was clear yellow no blood or clots noted.  It was reiterated that after exchange bladder spasms can worsen and should get better in a few days. Continue to drink plenty of water and utilize trospium to help manage spasms. Good Rx coupon was printed and script was sent to [facility]. Keep follow up for next exchange as scheduled

## 2020-05-14 ENCOUNTER — Other Ambulatory Visit: Payer: Self-pay

## 2020-05-14 ENCOUNTER — Ambulatory Visit: Payer: Medicare PPO | Admitting: Physician Assistant

## 2020-05-14 ENCOUNTER — Encounter: Payer: Self-pay | Admitting: Physician Assistant

## 2020-05-14 VITALS — BP 111/66 | HR 59 | Ht 64.0 in | Wt 160.0 lb

## 2020-05-14 DIAGNOSIS — R339 Retention of urine, unspecified: Secondary | ICD-10-CM

## 2020-05-14 NOTE — Progress Notes (Signed)
Cath Change/ Replacement  Patient is present today for a catheter change due to urinary retention.  50ml of water was removed from the balloon, a 14FR coude silicone foley cath was removed without difficulty.  Patient was cleaned and prepped in a sterile fashion with betadine and 2% lidocaine jelly was instilled into the urethra. A 14 FR coude silicone foley cath was replaced into the bladder no complications were noted Urine return was noted and urine was yellow in color. The balloon was filled with 52ml of sterile water. A night bag was attached for drainage. Patient declined a leg bag.    Performed by: Carman Ching, PA-C   Follow up: No follow-ups on file.

## 2020-06-12 ENCOUNTER — Other Ambulatory Visit: Payer: Self-pay

## 2020-06-12 ENCOUNTER — Ambulatory Visit (INDEPENDENT_AMBULATORY_CARE_PROVIDER_SITE_OTHER): Payer: Medicare PPO | Admitting: Physician Assistant

## 2020-06-12 DIAGNOSIS — R339 Retention of urine, unspecified: Secondary | ICD-10-CM

## 2020-06-12 NOTE — Progress Notes (Signed)
Cath Change/ Replacement  Patient is present today for a catheter change due to urinary retention.  41ml of water was removed from the balloon, a 14FR coude silicone foley cath was removed without difficulty.  Foreskin was retracted, patient was cleaned and prepped in a sterile fashion with betadine, and 2% lidocaine jelly was instilled into the urethra. A 14 FR silicone coud foley cath was replaced into the bladder no complications were noted Urine return was noted 35ml and urine was yellow in color. The balloon was filled with 73ml of sterile water and the foreskin was reduced. A night bag was attached for drainage.      Performed by: Carman Ching, PA-C   Follow up: Return in about 4 weeks (around 07/10/2020) for Catheter exchange.

## 2020-07-13 ENCOUNTER — Ambulatory Visit (INDEPENDENT_AMBULATORY_CARE_PROVIDER_SITE_OTHER): Payer: Medicare PPO | Admitting: Physician Assistant

## 2020-07-13 ENCOUNTER — Other Ambulatory Visit: Payer: Self-pay

## 2020-07-13 DIAGNOSIS — R339 Retention of urine, unspecified: Secondary | ICD-10-CM

## 2020-07-13 DIAGNOSIS — N3289 Other specified disorders of bladder: Secondary | ICD-10-CM

## 2020-07-13 MED ORDER — TROSPIUM CHLORIDE 20 MG PO TABS: 20.0000 mg | ORAL_TABLET | Freq: Every day | ORAL | 11 refills | Status: DC

## 2020-07-13 NOTE — Progress Notes (Signed)
Patient returned to clinic this afternoon with reports of nondraining Foley catheter since catheter placement this morning.  He denies pain or gross hematuria.  He reports the urge to urinate and some leakage around his Foley catheter tubing.  He has a history of bladder spasms on trospium.  On physical exam, there is a small volume of urine in the catheter tubing, however his night bag remained dry. At this point, I elected to irrigate the Foley catheter as follows.  Bladder Irrigation  Due to nondraining Foley patient is present today for a bladder irrigation. Patient was cleaned and prepped in a sterile fashion. 60 ml of saline was instilled into the bladder with a 27ml [place] syringe through the catheter in place.  I was unable to pull back on the syringe at this point consistent with catheter occlusion vs malplacement. At this point, I used a 10cc syringe to deflate the Foley balloon and advance the catheter. At this point, I reinflated the balloon with 10cc sterile water. [place] syringe was removed from the catheter and Foley was reattached to patient's night bag. of clear yellow urine immediately drained from the bladder. Patient tolerated well and reported improvement of urinary urgency upon completion. Foley now draining well.  Performed by: Carman Ching, PA-C   .

## 2020-07-13 NOTE — Progress Notes (Signed)
Cath Change/ Replacement  Patient is present today for a catheter change due to urinary retention.  18ml of water was removed from the balloon, a 14FR silicone coude foley cath was removed without difficulty.  Patient was cleaned and prepped in a sterile fashion with betadine and 2% lidocaine jelly was instilled into the urethra. A 14 FR silicone coude foley cath was replaced into the bladder no complications were noted Urine return was noted 12ml and urine was yellow in color. The balloon was filled with 54ml of sterile water. A night bag was attached for drainage.    Performed by: Carman Ching, PA-C   Follow up: Return in about 4 weeks (around 08/10/2020) for Catheter exchange.

## 2020-07-23 ENCOUNTER — Telehealth: Payer: Self-pay

## 2020-07-23 ENCOUNTER — Ambulatory Visit: Payer: Medicare PPO | Admitting: *Deleted

## 2020-07-23 ENCOUNTER — Other Ambulatory Visit: Payer: Self-pay

## 2020-07-23 DIAGNOSIS — R339 Retention of urine, unspecified: Secondary | ICD-10-CM

## 2020-07-23 NOTE — Telephone Encounter (Signed)
Patient daughter called stating the bag keeps coming off the catheter and they have tried everything to get it to stay on. I advised daughter to bring patient in so we can take a look at it. Pt daughter verbalized understanding and will come to clinic now.

## 2020-07-23 NOTE — Progress Notes (Signed)
Patient's came into office due to leaking at home at the site of connection between catheter and drainage bag. Upon observation, no leaking in clinic draining properly into bag. He stated it comes loose at times. Changed drainage bag and taped around connection.

## 2020-07-27 ENCOUNTER — Ambulatory Visit (INDEPENDENT_AMBULATORY_CARE_PROVIDER_SITE_OTHER): Payer: Medicare PPO | Admitting: Physician Assistant

## 2020-07-27 ENCOUNTER — Other Ambulatory Visit: Payer: Self-pay

## 2020-07-27 ENCOUNTER — Encounter: Payer: Self-pay | Admitting: Physician Assistant

## 2020-07-27 VITALS — Ht 64.0 in | Wt 160.0 lb

## 2020-07-27 DIAGNOSIS — T839XXA Unspecified complication of genitourinary prosthetic device, implant and graft, initial encounter: Secondary | ICD-10-CM | POA: Diagnosis not present

## 2020-07-27 NOTE — Progress Notes (Signed)
Cath Change/ Replacement  Patient is present today for a catheter change due to urinary retention.  71ml of water was removed from the balloon, a 14FR coud silicone foley cath was removed with out difficulty.  Patient was cleaned and prepped in a sterile fashion with betadine and 2% lidocaine jelly was instilled into the urethra. A 14 FR silicone coud foley cath was replaced into the bladder no complications were noted Urine return was noted 60ml and urine was yellow in color. The balloon was filled with 36ml of sterile water. A night bag was attached for drainage.  Patient tolerated well.  Performed by: Carman Ching, PA-C   Additional notes: Patient presented to clinic today with ongoing complaints of his night bag tubing becoming detached from the catheter causing leakage.  He is attempted to take these 2 structures together to no avail.  At this point, I recommended Foley catheter exchange in clinic as above.  Follow up: Return in about 4 weeks (around 08/24/2020) for Catheter exchange.

## 2020-08-05 ENCOUNTER — Other Ambulatory Visit: Payer: Self-pay

## 2020-08-05 ENCOUNTER — Encounter: Payer: Self-pay | Admitting: Physician Assistant

## 2020-08-05 ENCOUNTER — Telehealth: Payer: Self-pay

## 2020-08-05 ENCOUNTER — Ambulatory Visit (INDEPENDENT_AMBULATORY_CARE_PROVIDER_SITE_OTHER): Payer: Medicare PPO | Admitting: Physician Assistant

## 2020-08-05 VITALS — BP 153/82 | HR 94 | Ht 67.0 in | Wt 160.0 lb

## 2020-08-05 DIAGNOSIS — R338 Other retention of urine: Secondary | ICD-10-CM | POA: Diagnosis not present

## 2020-08-05 DIAGNOSIS — T83198A Other mechanical complication of other urinary devices and implants, initial encounter: Secondary | ICD-10-CM | POA: Diagnosis not present

## 2020-08-05 MED ORDER — SULFAMETHOXAZOLE-TRIMETHOPRIM 800-160 MG PO TABS: 1.0000 | ORAL_TABLET | Freq: Two times a day (BID) | ORAL | 0 refills | Status: AC

## 2020-08-05 NOTE — Progress Notes (Unsigned)
Bladder Irrigation  Due to nondraining Foley catheter patient is present today for a bladder irrigation. Patient was cleaned and prepped in a sterile fashion. 60 ml of sterile water was instilled and irrigated into the bladder with a 45ml [place] syringe through the catheter in place.  Minimal urinary return was obtained with pulling back on the [place] syringe consistent with catheter occlusion. Bladder irrigation failed.  Performed by: Carman Ching, PA-C   Cath Change/ Replacement  Patient is present today for a catheter change due to urinary retention.  17ml of water was removed from the balloon, a 14FR coude silicone foley cath was removed without difficulty.  Patient was cleaned and prepped in a sterile fashion with betadine. A 14 silicone coude FR foley cath was replaced into the bladder with some difficulty angulating the catheter tip through the bladder neck. Urine return was noted and urine was yellow in color with small clumps of debris. The balloon was filled with 18ml of sterile water. A night bag was attached for drainage.  Patient tolerated well.   Performed by: Carman Ching, PA-C

## 2020-08-05 NOTE — Progress Notes (Signed)
08/05/2020 4:32 PM   James Mata 07/11/1931 [id number]  CC: Chief Complaint  Patient presents with   Urinary Retention   HPI: James Mata is a 87 y.o. male with PMH BPH s/p greenlight photo vaporization in 2014 who subsequently developed urethral stricture versus bladder neck contracture with multiple failed voiding trials, now managed with chronic indwelling Foley catheter, who presents today for nondraining Foley catheter and abdominal pain.  Notably, his bladder neck contracture has progressively worsened over the past several months such that now he is requiring a 14 [place] coud catheter to pass into his bladder.  In December, I had to reposition his Foley catheter after several days when it was found to not be draining appropriately.  Today he reports low urinary output from his Foley catheter, abdominal distention, and severe abdominal pain.  He denies fever, chills, nausea, or vomiting.    PMH: Past Medical History:  Diagnosis Date   CHF (congestive heart failure) (HCC)    Enlarged prostate    Hyperlipidemia    Hypertension     Surgical History: Past Surgical History:  Procedure Laterality Date   CORONARY ANGIOPLASTY WITH STENT PLACEMENT     HERNIA REPAIR     PROSTATE ABLATION      Home Medications:  Allergies as of 08/05/2020   No Known Allergies     Medication List       Accurate as of August 05, 2020  4:32 PM. If you have any questions, ask your nurse or doctor.        aspirin 81 MG EC tablet Take by mouth.   cyclobenzaprine 5 MG tablet Commonly known as: FLEXERIL Take 5 mg by mouth 2 (two) times daily as needed.   doxazosin 4 MG tablet Commonly known as: CARDURA Take 4 mg by mouth daily.   furosemide 80 MG tablet Commonly known as: LASIX Take 80 mg by mouth 2 (two) times daily.   isosorbide mononitrate 60 MG 24 hr tablet Commonly known as: IMDUR Take by mouth.   metoprolol succinate 25 MG 24 hr tablet Commonly known as:  TOPROL-XL Take 12.5 mg by mouth daily.   nitroGLYCERIN 0.4 MG SL tablet Commonly known as: NITROSTAT SMARTSIG:Tablet(s) Sublingual As Directed   potassium chloride SA 20 MEQ tablet Commonly known as: KLOR-CON   sulfamethoxazole-trimethoprim 800-160 MG tablet Commonly known as: BACTRIM DS Take 1 tablet by mouth 2 (two) times daily for 7 days. Started by: Carman Ching, PA-C   traMADol 50 MG tablet Commonly known as: ULTRAM Take 50 mg by mouth 2 (two) times daily as needed.   trospium 20 MG tablet Commonly known as: SANCTURA Take 1 tablet (20 mg total) by mouth daily.       Allergies:  No Known Allergies  Family History: No family history on file.  Social History:   reports that he has never smoked. He has never used smokeless tobacco. He reports previous alcohol use. He reports previous drug use.  Physical Exam: BP (!) 153/82    Pulse 94    Ht 5\' 7"  (1.702 m)    Wt 160 lb (72.6 kg)    BMI 25.06 kg/m   Constitutional:  Alert and oriented, no acute distress, nontoxic appearing HEENT: [place], AT Cardiovascular: No clubbing, cyanosis, or edema Respiratory: Normal respiratory effort, no increased work of breathing GI: Palpable bladder above the umbilicus Skin: No rashes, bruises or suspicious lesions Neurologic: Grossly intact, no focal deficits, moving all 4 extremities Psychiatric: Normal mood and affect  Laboratory Data: Results for orders placed or performed in visit on 08/05/20  Microscopic Examination   Urine  Result Value Ref Range   WBC, UA >30 (A) 0 - 5 /hpf   RBC 11-30 (A) 0 - 2 /hpf   Epithelial Cells (non renal) 0-10 0 - 10 /hpf   Casts Present (A) None seen /lpf   Cast Type Hyaline casts N/A   Crystals Present (A) N/A   Crystal Type Calcium Oxalate N/A   Bacteria, UA Many (A) None seen/Few  Urinalysis, Complete  Result Value Ref Range   Specific Gravity, UA 1.020 1.005 - 1.030   pH, UA 5.0 5.0 - 7.5   Color, UA Yellow Yellow   Appearance Ur  Cloudy (A) Clear   Leukocytes,UA 3+ (A) Negative   Protein,UA 1+ (A) Negative/Trace   Glucose, UA Negative Negative   Ketones, UA Negative Negative   RBC, UA 3+ (A) Negative   Bilirubin, UA Negative Negative   Urobilinogen, Ur 1.0 0.2 - 1.0 mg/dL   Nitrite, UA Negative Negative   Microscopic Examination See below:    Assessment & Plan:   1. Retention of urine due to occlusion of Foley catheter [facility]) Occluded catheter with urinary retention noted on physical exam today.  I initially attempted to irrigate the catheter, however due to the small caliber this was extremely difficult.  Ultimately, I elected to exchange the Foley catheter in clinic.  See separate procedure notes for details.  Notably, I had difficulty passing the Foley catheter through the level of the patient's bladder neck but was ultimately successful.  With his recent history of increased difficulty passing a catheter through the bladder neck, I recommend cystoscopy with Dr. Lonna Cobb at this time for evaluation of possible recurrent urethral stricture versus bladder neck contracture.  Patient is in agreement with this plan.  Will treat with empiric Bactrim DS twice daily x7 days given difficult catheterization in clinic today and plans for cystoscopy in 2 weeks.  Urine sample obtained from his new Foley catheter for UA and culture. - Urinalysis, Complete - CULTURE, URINE COMPREHENSIVE - sulfamethoxazole-trimethoprim (BACTRIM DS) 800-160 MG tablet; Take 1 tablet by mouth 2 (two) times daily for 7 days.  Dispense: 14 tablet; Refill: 0  Return in about 2 weeks (around 08/19/2020) for Cystoscopy with Dr. Lonna Cobb.  Carman Ching, PA-C  [facility] [address], [address] [place], [place] [address] [phone]

## 2020-08-05 NOTE — Telephone Encounter (Signed)
Incoming call on triage line from pt's daughter who states that no urine is draining into patient's cath bag. She states that pt is c/o lower abdominal pain and severe burning. Pt added to schedule for first available slot this afternoon. Daughter gave verbal understanding.

## 2020-08-06 ENCOUNTER — Telehealth: Payer: Self-pay

## 2020-08-06 ENCOUNTER — Ambulatory Visit (INDEPENDENT_AMBULATORY_CARE_PROVIDER_SITE_OTHER): Payer: Medicare PPO | Admitting: Urology

## 2020-08-06 ENCOUNTER — Other Ambulatory Visit: Payer: Self-pay | Admitting: Radiology

## 2020-08-06 ENCOUNTER — Encounter: Payer: Self-pay | Admitting: Urology

## 2020-08-06 DIAGNOSIS — N99114 Postprocedural urethral stricture, male, unspecified: Secondary | ICD-10-CM | POA: Diagnosis not present

## 2020-08-06 DIAGNOSIS — N320 Bladder-neck obstruction: Secondary | ICD-10-CM

## 2020-08-06 LAB — URINALYSIS, COMPLETE
Bilirubin, UA: NEGATIVE
Glucose, UA: NEGATIVE
Ketones, UA: NEGATIVE
Nitrite, UA: NEGATIVE
Specific Gravity, UA: 1.02 (ref 1.005–1.030)
Urobilinogen, Ur: 1 mg/dL (ref 0.2–1.0)
pH, UA: 5 (ref 5.0–7.5)

## 2020-08-06 LAB — MICROSCOPIC EXAMINATION: WBC, UA: >30 /hpf — AB (ref 0–5)

## 2020-08-06 LAB — BLADDER SCAN AMB NON-IMAGING: Scan Result: 41

## 2020-08-06 NOTE — H&P (View-Only) (Signed)
  08/06/2020 7:32 PM   Keena L Machamer 06/23/1931 [id number]  Referring provider: Morayati, Shamil J, MD [address] [place],  [place] [address]  Chief Complaint  Patient presents with  . catheter difficulties   Urological history 1. BPH with retention - s/p PVP 2014 - currently managed with an indwelling Foley  2. Elevated PSA - previous prostate biopsy late 1990's for PSA 4.7 - PSA screening discontinued due to age and co-mobidities  3. Bladder neck contracture - cystoscopy with dilation and catheter placement by Dr. Eskridge with findings of an 8 French stricture versus bladder neck contracture.  He was able to place a 16 French Councill catheter in 01/2020 - cysto 02/2020 NED  HPI: Mr. Rockhold is an 87 year old male who presents today after daughter had called after her father called her stating that his catheter was not draining.    Patient was seen yesterday for the same concerns and was seen by Sam.  At that time his catheter was not draining and his bladder was palpable to the umbilicus.  The catheter was found to be occluded and was exchanged successfully here in the office.  He had a return of 950 mL of yellow-colored urine with small clumps of debris.  Upon presentation to the office today, the overnight bag had approximately 500 cc of urine and was draining well.  A bladder scan confirmed adequate bladder draining with a PVR of 40 cc.  I also easily irrigated the catheter with 70 mils of sterile water.  Patient denies any modifying or aggravating factors.  Patient denies any gross hematuria, dysuria or suprapubic/flank pain.  Patient denies any fevers, chills, nausea or vomiting.   He has also been having issues with constipation and had been taking prune juice to help relieve the constipation and had a good bowel movement last night.   PMH: Past Medical History:  Diagnosis Date  . CHF (congestive heart failure) (HCC)   . Complication of anesthesia    had  trouble waking up in 2014  . Enlarged prostate   . Hyperlipidemia   . Hypertension     Surgical History: Past Surgical History:  Procedure Laterality Date  . CORONARY ANGIOPLASTY WITH STENT PLACEMENT    . HERNIA REPAIR    . PROSTATE ABLATION      Home Medications:  Allergies as of 08/06/2020   No Known Allergies     Medication List       Accurate as of August 06, 2020 11:59 PM. If you have any questions, ask your nurse or doctor.        aspirin 81 MG EC tablet Take 81 mg by mouth daily.   cyclobenzaprine 5 MG tablet Commonly known as: FLEXERIL Take 5 mg by mouth at bedtime.   doxazosin 4 MG tablet Commonly known as: CARDURA Take 4 mg by mouth at bedtime.   fluticasone 50 MCG/ACT nasal spray Commonly known as: FLONASE Place 1 spray into both nostrils daily as needed for allergies or rhinitis.   furosemide 80 MG tablet Commonly known as: LASIX Take 80 mg by mouth 2 (two) times daily.   isosorbide mononitrate 60 MG 24 hr tablet Commonly known as: IMDUR Take 30 mg by mouth daily.   metoprolol succinate 25 MG 24 hr tablet Commonly known as: TOPROL-XL Take 12.5 mg by mouth daily.   multivitamin with minerals tablet Take 1 tablet by mouth daily.   nitroGLYCERIN 0.4 MG SL tablet Commonly known as: NITROSTAT Place 0.4 mg   under the tongue every 5 (five) minutes as needed for chest pain.   potassium chloride SA 20 MEQ tablet Commonly known as: KLOR-CON Take 20 mEq by mouth daily.   sulfamethoxazole-trimethoprim 800-160 MG tablet Commonly known as: BACTRIM DS Take 1 tablet by mouth 2 (two) times daily for 7 days.   traMADol 50 MG tablet Commonly known as: ULTRAM Take 50 mg by mouth 2 (two) times daily as needed for severe pain.   trospium 20 MG tablet Commonly known as: SANCTURA Take 1 tablet (20 mg total) by mouth daily.       Allergies: No Known Allergies  Family History: No family history on file.  Social History:  reports that he has never  smoked. He has never used smokeless tobacco. He reports previous alcohol use. He reports previous drug use.  ROS: Pertinent ROS in HPI  Physical Exam: Constitutional:  Well nourished. Alert and oriented, No acute distress. HEENT: [place] AT, mask in place.  Trachea midline Cardiovascular: No clubbing, cyanosis, or edema. Respiratory: Normal respiratory effort, no increased work of breathing. GU: No CVA tenderness.  No bladder fullness or masses.  Patient with uncircumcised phallus.  Foreskin easily retracted.  Foley in place draining yellow urine.  Neurologic: Grossly intact, no focal deficits, moving all 4 extremities. Psychiatric: Normal mood and affect.  Laboratory Data: Urinalysis Component     Latest Ref Rng & Units 08/05/2020  Specific Gravity, UA     1.005 - 1.030 1.020  pH, UA     5.0 - 7.5 5.0  Color, UA     Yellow Yellow  Appearance Ur     Clear Cloudy (A)  Leukocytes,UA     Negative 3+ (A)  Protein,UA     Negative/Trace 1+ (A)  Glucose, UA     Negative Negative  Ketones, UA     Negative Negative  RBC, UA     Negative 3+ (A)  Bilirubin, UA     Negative Negative  Urobilinogen, Ur     0.2 - 1.0 mg/dL 1.0  Nitrite, UA     Negative Negative  Microscopic Examination      See below:   Component     Latest Ref Rng & Units 08/05/2020  WBC, UA     0 - 5 /hpf >30 (A)  RBC     4.14 - 5.80 x10E6/uL 11-30 (A)  Epithelial Cells (non renal)     0 - 10 /hpf 0-10  Casts     None seen /lpf Present (A)  Cast Type     N/A Hyaline casts  Crystals     N/A Present (A)  Crystal Type     N/A Calcium Oxalate  Bacteria, UA     None seen/Few Many (A)  I have reviewed the labs.   Pertinent Imaging: Results for [name], [name] (MRN [id number]) as of 08/06/2020 13:56  Ref. Range 08/06/2020 13:49  Scan Result Unknown 41 ml    Assessment & Plan:  87 year old male with a history of elevated PSA, BPH with urinary retention treated with PVP and a bladder neck and contracture  currently managed with indwelling Foley who presented today for concerns of inadequate drainage.  Upon entering the room the Foley was draining well with 500 cc in the bag.  There was no hematuria, clots or debris seen in the catheter tubing.  The Foley was also able to be irrigated easily.  Recommendations: Continue to drink fluids Continue to manage constipation Will return for cystoscopy with  possible ureteral dilation and possible DVIU in the OR with Dr. Lonna Cobb.  Patient is advised to stop his ASA 81 mg today.  I described how the procedures performed and the risks involved (pain, bleeding, infection, etc.).  Urine culture is pending from yesterday.  BMP and CBC are drawn today.   Return for cysto, urethral dilation possible DVIU. Marland Kitchen  These notes generated with voice recognition software. I apologize for typographical errors.  Michiel Cowboy, PA-C  [facility] [address]  [address] [place], [place] [address] [phone]

## 2020-08-06 NOTE — Telephone Encounter (Signed)
Patient daughter Bosie Clos called, stating patient is complaining of his catheter not draining. Advised pt daughter to bring patient into clinic. Patient was concerned he was not draining as much as he was drinking.

## 2020-08-06 NOTE — Progress Notes (Signed)
08/06/2020 7:32 PM   James Mata 1931/07/02 [id number]  Referring provider: Alan Mulder, MD [address] [place],  [place] [address]  Chief Complaint  Patient presents with  . catheter difficulties   Urological history 1. BPH with retention - s/p PVP 2014 - currently managed with an indwelling Foley  2. Elevated PSA - previous prostate biopsy late 1990's for PSA 4.7 - PSA screening discontinued due to age and co-mobidities  3. Bladder neck contracture - cystoscopy with dilation and catheter placement by James Mata with findings of an 8 French stricture versus bladder neck contracture.  He was able to place a 16 French Councill catheter in 01/2020 - cysto 02/2020 NED  HPI: James Mata is an 87 year old male who presents today after daughter had called after her father called her stating that his catheter was not draining.    Patient was seen yesterday for the same concerns and was seen by James Mata.  At that time his catheter was not draining and his bladder was palpable to the umbilicus.  The catheter was found to be occluded and was exchanged successfully here in the office.  He had a return of 950 mL of yellow-colored urine with small clumps of debris.  Upon presentation to the office today, the overnight bag had approximately 500 cc of urine and was draining well.  A bladder scan confirmed adequate bladder draining with a PVR of 40 cc.  I also easily irrigated the catheter with 70 mils of sterile water.  Patient denies any modifying or aggravating factors.  Patient denies any gross hematuria, dysuria or suprapubic/flank pain.  Patient denies any fevers, chills, nausea or vomiting.   He has also been having issues with constipation and had been taking prune juice to help relieve the constipation and had a good bowel movement last night.   PMH: Past Medical History:  Diagnosis Date  . CHF (congestive heart failure) (HCC)   . Complication of anesthesia    had  trouble waking up in 2014  . Enlarged prostate   . Hyperlipidemia   . Hypertension     Surgical History: Past Surgical History:  Procedure Laterality Date  . CORONARY ANGIOPLASTY WITH STENT PLACEMENT    . HERNIA REPAIR    . PROSTATE ABLATION      Home Medications:  Allergies as of 08/06/2020   No Known Allergies     Medication List       Accurate as of August 06, 2020 11:59 PM. If you have any questions, ask your nurse or doctor.        aspirin 81 MG EC tablet Take 81 mg by mouth daily.   cyclobenzaprine 5 MG tablet Commonly known as: FLEXERIL Take 5 mg by mouth at bedtime.   doxazosin 4 MG tablet Commonly known as: CARDURA Take 4 mg by mouth at bedtime.   fluticasone 50 MCG/ACT nasal spray Commonly known as: FLONASE Place 1 spray into both nostrils daily as needed for allergies or rhinitis.   furosemide 80 MG tablet Commonly known as: LASIX Take 80 mg by mouth 2 (two) times daily.   isosorbide mononitrate 60 MG 24 hr tablet Commonly known as: IMDUR Take 30 mg by mouth daily.   metoprolol succinate 25 MG 24 hr tablet Commonly known as: TOPROL-XL Take 12.5 mg by mouth daily.   multivitamin with minerals tablet Take 1 tablet by mouth daily.   nitroGLYCERIN 0.4 MG SL tablet Commonly known as: NITROSTAT Place 0.4 mg  under the tongue every 5 (five) minutes as needed for chest pain.   potassium chloride SA 20 MEQ tablet Commonly known as: KLOR-CON Take 20 mEq by mouth daily.   sulfamethoxazole-trimethoprim 800-160 MG tablet Commonly known as: BACTRIM DS Take 1 tablet by mouth 2 (two) times daily for 7 days.   traMADol 50 MG tablet Commonly known as: ULTRAM Take 50 mg by mouth 2 (two) times daily as needed for severe pain.   trospium 20 MG tablet Commonly known as: SANCTURA Take 1 tablet (20 mg total) by mouth daily.       Allergies: No Known Allergies  Family History: No family history on file.  Social History:  reports that he has never  smoked. He has never used smokeless tobacco. He reports previous alcohol use. He reports previous drug use.  ROS: Pertinent ROS in HPI  Physical Exam: Constitutional:  Well nourished. Alert and oriented, No acute distress. HEENT: [place] AT, mask in place.  Trachea midline Cardiovascular: No clubbing, cyanosis, or edema. Respiratory: Normal respiratory effort, no increased work of breathing. GU: No CVA tenderness.  No bladder fullness or masses.  Patient with uncircumcised phallus.  Foreskin easily retracted.  Foley in place draining yellow urine.  Neurologic: Grossly intact, no focal deficits, moving all 4 extremities. Psychiatric: Normal mood and affect.  Laboratory Data: Urinalysis Component     Latest Ref Rng & Units 08/05/2020  Specific Gravity, UA     1.005 - 1.030 1.020  pH, UA     5.0 - 7.5 5.0  Color, UA     Yellow Yellow  Appearance Ur     Clear Cloudy (A)  Leukocytes,UA     Negative 3+ (A)  Protein,UA     Negative/Trace 1+ (A)  Glucose, UA     Negative Negative  Ketones, UA     Negative Negative  RBC, UA     Negative 3+ (A)  Bilirubin, UA     Negative Negative  Urobilinogen, Ur     0.2 - 1.0 mg/dL 1.0  Nitrite, UA     Negative Negative  Microscopic Examination      See below:   Component     Latest Ref Rng & Units 08/05/2020  WBC, UA     0 - 5 /hpf >30 (A)  RBC     4.14 - 5.80 x10E6/uL 11-30 (A)  Epithelial Cells (non renal)     0 - 10 /hpf 0-10  Casts     None seen /lpf Present (A)  Cast Type     N/A Hyaline casts  Crystals     N/A Present (A)  Crystal Type     N/A Calcium Oxalate  Bacteria, UA     None seen/Few Many (A)  I have reviewed the labs.   Pertinent Imaging: Results for James Mata (MRN [id number]) as of 08/06/2020 13:56  Ref. Range 08/06/2020 13:49  Scan Result Unknown 41 ml    Assessment & Plan:  87 year old male with a history of elevated PSA, BPH with urinary retention treated with PVP and a bladder neck and contracture  currently managed with indwelling Foley who presented today for concerns of inadequate drainage.  Upon entering the room the Foley was draining well with 500 cc in the bag.  There was no hematuria, clots or debris seen in the catheter tubing.  The Foley was also able to be irrigated easily.  Recommendations: Continue to drink fluids Continue to manage constipation Will return for cystoscopy with  possible ureteral dilation and possible DVIU in the OR with Dr. Lonna Cobb.  Patient is advised to stop his ASA 81 mg today.  I described how the procedures performed and the risks involved (pain, bleeding, infection, etc.).  Urine culture is pending from yesterday.  BMP and CBC are drawn today.   Return for cysto, urethral dilation possible DVIU. Marland Kitchen  These notes generated with voice recognition software. I apologize for typographical errors.  Michiel Cowboy, PA-C  [facility] [address]  [address] [place], [place] [address] [phone]

## 2020-08-07 ENCOUNTER — Other Ambulatory Visit: Payer: Self-pay

## 2020-08-07 ENCOUNTER — Ambulatory Visit (INDEPENDENT_AMBULATORY_CARE_PROVIDER_SITE_OTHER): Payer: Medicare PPO | Admitting: Physician Assistant

## 2020-08-07 ENCOUNTER — Other Ambulatory Visit
Admission: RE | Admit: 2020-08-07 | Discharge: 2020-08-07 | Disposition: A | Discharge status: Home / Self Care | Payer: Medicare PPO | Source: Ambulatory Visit | Attending: Urology | Admitting: Urology

## 2020-08-07 DIAGNOSIS — Z20822 Contact with and (suspected) exposure to covid-19: Secondary | ICD-10-CM | POA: Diagnosis not present

## 2020-08-07 DIAGNOSIS — Z01812 Encounter for preprocedural laboratory examination: Secondary | ICD-10-CM | POA: Diagnosis present

## 2020-08-07 DIAGNOSIS — T839XXD Unspecified complication of genitourinary prosthetic device, implant and graft, subsequent encounter: Secondary | ICD-10-CM

## 2020-08-07 HISTORY — DX: Other complications of anesthesia, initial encounter: T88.59XA

## 2020-08-07 LAB — BASIC METABOLIC PANEL
BUN/Creatinine Ratio: 12 (ref 10–24)
BUN: 17 mg/dL (ref 8–27)
CO2: 22 mmol/L (ref 20–29)
Calcium: 9.2 mg/dL (ref 8.6–10.2)
Chloride: 100 mmol/L (ref 96–106)
Creatinine, Ser: 1.38 mg/dL — ABNORMAL HIGH (ref 0.76–1.27)
GFR calc Af Amer: 53 mL/min/{1.73_m2} — ABNORMAL LOW (ref >59)
GFR calc non Af Amer: 46 mL/min/{1.73_m2} — ABNORMAL LOW (ref >59)
Glucose: 76 mg/dL (ref 65–99)
Potassium: 3.9 mmol/L (ref 3.5–5.2)
Sodium: 139 mmol/L (ref 134–144)

## 2020-08-07 LAB — CBC WITH DIFFERENTIAL
Basophils Absolute: 0.1 10*3/uL (ref 0.0–0.2)
Basos: 1 %
EOS (ABSOLUTE): 0.4 10*3/uL (ref 0.0–0.4)
Eos: 4 %
Hematocrit: 39.9 % (ref 37.5–51.0)
Hemoglobin: 14.5 g/dL (ref 13.0–17.7)
Immature Grans (Abs): 0.1 10*3/uL (ref 0.0–0.1)
Immature Granulocytes: 1 %
Lymphocytes Absolute: 1.3 10*3/uL (ref 0.7–3.1)
Lymphs: 16 %
MCH: 33.1 pg — ABNORMAL HIGH (ref 26.6–33.0)
MCHC: 36.3 g/dL — ABNORMAL HIGH (ref 31.5–35.7)
MCV: 91 fL (ref 79–97)
Monocytes Absolute: 0.6 10*3/uL (ref 0.1–0.9)
Monocytes: 7 %
Neutrophils Absolute: 5.9 10*3/uL (ref 1.4–7.0)
Neutrophils: 71 %
RBC: 4.38 x10E6/uL (ref 4.14–5.80)
RDW: 12.9 % (ref 11.6–15.4)
WBC: 8.2 10*3/uL (ref 3.4–10.8)

## 2020-08-07 LAB — BLADDER SCAN AMB NON-IMAGING

## 2020-08-07 LAB — SARS CORONAVIRUS 2 (TAT 6-24 HRS): SARS Coronavirus 2: NEGATIVE

## 2020-08-07 NOTE — Progress Notes (Signed)
Patient returned to clinic today with reports of a nondraining Foley catheter.  On presentation today, there is urine draining from his Foley catheter into his drainage bag.  Patient reports he was concerned that there was less drainage than there typically is.  Bladder scan 11mL.  Results for orders placed or performed in visit on 08/07/20  BLADDER SCAN AMB NON-IMAGING  Result Value Ref Range   Scan Result 67mL     Reassured patient that his catheter is draining appropriately.  Patient to follow-up as scheduled for cystoscopy with possible urethral dilation versus DVIU with Dr. Lonna Cobb in 4 days.  Patient is accompanied in clinic today by his friend, who requests instruction in catheter irrigation in case of occlusion over the weekend given forecasts for inclement weather. I provided him an irrigation tray and instructed him accordingly. I counseled him to not irrigate the catheter unless it stops draining entirely and the patient develops abdominal pain consistent with urinary retention. He expressed understanding.

## 2020-08-07 NOTE — Patient Instructions (Addendum)
Your procedure is scheduled on: Tuesday August 11, 2020. Report to Day Surgery inside [facility] 2nd floor (stop by Admission desk first, before going upstairs) To find out your arrival time please call [phone] between 1PM - 3PM on Monday August 10, 2020.   Remember: Instructions that are not followed completely may result in serious medical risk,  up to and including death, or upon the discretion of your surgeon and anesthesiologist your  surgery may need to be rescheduled.     _X__ 1. Do not eat food after midnight the night before your procedure.                 No chewing gum or hard candies. You may drink clear liquids up to 2 hours                 before you are scheduled to arrive for your surgery- DO not drink clear                 liquids within 2 hours of the start of your surgery.                 Clear Liquids include:  water, apple juice without pulp, clear Gatorade, G2 or                  Gatorade Zero (avoid Red/Purple/Blue), Black Coffee or Tea (Do not add                 anything to coffee or tea).  __X__2.  On the morning of surgery brush your teeth with toothpaste and water, you                may rinse your mouth with mouthwash if you wish.  Do not swallow any toothpaste of mouthwash.     _X__ 3.  No Alcohol for 24 hours before or after surgery.   _X__ 4.  Do Not Smoke or use e-cigarettes For 24 Hours Prior to Your Surgery.                 Do not use any chewable tobacco products for at least 6 hours prior to                 Surgery.  _X__  5.  Do not use any recreational drugs (marijuana, cocaine, heroin, ecstasy, MDMA or other)                For at least one week prior to your surgery.  Combination of these drugs with anesthesia                May have life threatening results.  __X__ 6.  Notify your doctor if there is any change in your medical condition      (cold, fever, infections).     Do not wear jewelry, make-up, hairpins, clips or  nail polish. Do not wear lotions, powders, or perfumes. You may wear deodorant. Do not shave 48 hours prior to surgery. Men may shave face and neck. Do not bring valuables to the hospital.    [facility] is not responsible for any belongings or valuables.  Contacts, dentures or bridgework may not be worn into surgery. Leave your suitcase in the car. After surgery it may be brought to your room. For patients admitted to the hospital, discharge time is determined by your treatment team.   Patients discharged the day of surgery will not be allowed to drive  home.   Make arrangements for someone to be with you for the first 24 hours of your Same Day Discharge.   __X__ Take these medicines the morning of surgery with A SIP OF WATER:    1. isosorbide mononitrate (IMDUR) 60 MG  2. metoprolol succinate (TOPROL-XL) 25 MG   3. traMADol (ULTRAM) 50 MG tablet  4. trospium (SANCTURA) 20 MG   5. sulfamethoxazole-trimethoprim (BACTRIM DS) 800-160 MG tablet    ____ Fleet Enema (as directed)   ____ Use CHG Soap (or wipes) as directed  ____ Use Benzoyl Peroxide Gel as instructed  __X__ Use inhalers on the day of surgery   fluticasone (FLONASE) 50 MCG/ACT nasal spray  ____ Stop metformin 2 days prior to surgery    ____ Take 1/2 of usual insulin dose the night before surgery. No insulin the morning          of surgery.   __x__ Stop aspirin 81 Mg as instructed    __x__ Stop Anti-inflammatories such as Ibuprofen, Aleve, Advil, naproxen, and or BC powders.   _x___ Stop supplements until after surgery.    __x__ Do not start any herbal supplements before your procedure.    If you have any questions regarding your pre-procedure instructions,  Please call Pre-admit Testing at [phone].

## 2020-08-10 ENCOUNTER — Ambulatory Visit: Payer: Self-pay | Admitting: Physician Assistant

## 2020-08-11 ENCOUNTER — Other Ambulatory Visit: Payer: Self-pay

## 2020-08-11 ENCOUNTER — Encounter
Admission: RE | Disposition: A | Discharge status: Home / Self Care | Payer: Self-pay | Source: Home / Self Care | Attending: Urology

## 2020-08-11 ENCOUNTER — Ambulatory Visit: Payer: Medicare PPO

## 2020-08-11 ENCOUNTER — Ambulatory Visit
Admission: RE | Admit: 2020-08-11 | Discharge: 2020-08-11 | Disposition: A | Discharge status: Home / Self Care | Payer: Medicare PPO | Attending: Urology | Admitting: Urology

## 2020-08-11 ENCOUNTER — Ambulatory Visit: Payer: Medicare PPO | Admitting: Anesthesiology

## 2020-08-11 ENCOUNTER — Encounter: Payer: Self-pay | Admitting: Urology

## 2020-08-11 DIAGNOSIS — N401 Enlarged prostate with lower urinary tract symptoms: Secondary | ICD-10-CM | POA: Insufficient documentation

## 2020-08-11 DIAGNOSIS — Z79899 Other long term (current) drug therapy: Secondary | ICD-10-CM | POA: Diagnosis not present

## 2020-08-11 DIAGNOSIS — R972 Elevated prostate specific antigen [PSA]: Secondary | ICD-10-CM | POA: Insufficient documentation

## 2020-08-11 DIAGNOSIS — R338 Other retention of urine: Secondary | ICD-10-CM | POA: Insufficient documentation

## 2020-08-11 DIAGNOSIS — K5900 Constipation, unspecified: Secondary | ICD-10-CM | POA: Diagnosis not present

## 2020-08-11 DIAGNOSIS — Z7982 Long term (current) use of aspirin: Secondary | ICD-10-CM | POA: Insufficient documentation

## 2020-08-11 DIAGNOSIS — R339 Retention of urine, unspecified: Secondary | ICD-10-CM | POA: Diagnosis not present

## 2020-08-11 DIAGNOSIS — N320 Bladder-neck obstruction: Secondary | ICD-10-CM

## 2020-08-11 HISTORY — PX: CYSTOSCOPY WITH DIRECT VISION INTERNAL URETHROTOMY: SHX6637

## 2020-08-11 HISTORY — PX: CYSTOSCOPY WITH URETHRAL DILATATION: SHX5125

## 2020-08-11 HISTORY — PX: CYSTOSCOPY: SHX5120

## 2020-08-11 SURGERY — CYSTOSCOPY
Anesthesia: General | Site: Urethra

## 2020-08-11 MED ORDER — FUROSEMIDE 10 MG/ML IJ SOLN
40.0000 mg | Freq: Once | INTRAMUSCULAR | Status: AC
  Administered 2020-08-11: 40 mg via INTRAVENOUS

## 2020-08-11 MED ORDER — CEFAZOLIN SODIUM-DEXTROSE 1-4 GM/50ML-% IV SOLN
INTRAVENOUS | Status: AC
  Filled 2020-08-11: qty 50

## 2020-08-11 MED ORDER — LIDOCAINE HCL (PF) 2 % IJ SOLN
INTRAMUSCULAR | Status: AC
  Filled 2020-08-11: qty 5

## 2020-08-11 MED ORDER — FENTANYL CITRATE (PF) 100 MCG/2ML IJ SOLN
INTRAMUSCULAR | Status: DC | PRN
  Administered 2020-08-11: 50 ug via INTRAVENOUS

## 2020-08-11 MED ORDER — ONDANSETRON HCL 4 MG/2ML IJ SOLN: 4.0000 mg | Freq: Once | INTRAMUSCULAR | Status: DC | PRN

## 2020-08-11 MED ORDER — PROPOFOL 500 MG/50ML IV EMUL
INTRAVENOUS | Status: AC
  Filled 2020-08-11: qty 50

## 2020-08-11 MED ORDER — CHLORHEXIDINE GLUCONATE 0.12 % MT SOLN: 15.0000 mL | Freq: Once | OROMUCOSAL | Status: AC

## 2020-08-11 MED ORDER — DEXAMETHASONE SODIUM PHOSPHATE 10 MG/ML IJ SOLN
INTRAMUSCULAR | Status: AC
  Filled 2020-08-11: qty 1

## 2020-08-11 MED ORDER — IPRATROPIUM-ALBUTEROL 0.5-2.5 (3) MG/3ML IN SOLN: 3.0000 mL | Freq: Once | RESPIRATORY_TRACT | Status: AC

## 2020-08-11 MED ORDER — IPRATROPIUM-ALBUTEROL 0.5-2.5 (3) MG/3ML IN SOLN
RESPIRATORY_TRACT | Status: AC
  Filled 2020-08-11: qty 3

## 2020-08-11 MED ORDER — CHLORHEXIDINE GLUCONATE 0.12 % MT SOLN
OROMUCOSAL | Status: AC
  Administered 2020-08-11: 15 mL via OROMUCOSAL
  Filled 2020-08-11: qty 15

## 2020-08-11 MED ORDER — FENTANYL CITRATE (PF) 100 MCG/2ML IJ SOLN
INTRAMUSCULAR | Status: AC
  Filled 2020-08-11: qty 2

## 2020-08-11 MED ORDER — EPHEDRINE SULFATE 50 MG/ML IJ SOLN
INTRAMUSCULAR | Status: DC | PRN
  Administered 2020-08-11: 10 mg via INTRAVENOUS
  Administered 2020-08-11 (×3): 5 mg via INTRAVENOUS

## 2020-08-11 MED ORDER — EPHEDRINE 5 MG/ML INJ
INTRAVENOUS | Status: AC
  Filled 2020-08-11: qty 20

## 2020-08-11 MED ORDER — FUROSEMIDE 10 MG/ML IJ SOLN
INTRAMUSCULAR | Status: AC
  Filled 2020-08-11: qty 4

## 2020-08-11 MED ORDER — OXYCODONE HCL 5 MG/5ML PO SOLN: 5.0000 mg | Freq: Once | ORAL | Status: DC | PRN

## 2020-08-11 MED ORDER — OXYCODONE HCL 5 MG PO TABS: 5.0000 mg | ORAL_TABLET | Freq: Once | ORAL | Status: DC | PRN

## 2020-08-11 MED ORDER — CEFAZOLIN SODIUM-DEXTROSE 1-4 GM/50ML-% IV SOLN
1.0000 g | INTRAVENOUS | Status: AC
  Administered 2020-08-11: 2 g via INTRAVENOUS

## 2020-08-11 MED ORDER — ORAL CARE MOUTH RINSE: 15.0000 mL | Freq: Once | OROMUCOSAL | Status: AC

## 2020-08-11 MED ORDER — DEXMEDETOMIDINE (PRECEDEX) IN NS 20 MCG/5ML (4 MCG/ML) IV SYRINGE
PREFILLED_SYRINGE | INTRAVENOUS | Status: DC | PRN
  Administered 2020-08-11: 8 ug via INTRAVENOUS
  Administered 2020-08-11: 4 ug via INTRAVENOUS

## 2020-08-11 MED ORDER — PROPOFOL 500 MG/50ML IV EMUL
INTRAVENOUS | Status: DC | PRN
  Administered 2020-08-11: 88 ug/kg/min via INTRAVENOUS

## 2020-08-11 MED ORDER — ACETAMINOPHEN 10 MG/ML IV SOLN: 1000.0000 mg | Freq: Once | INTRAVENOUS | Status: DC | PRN

## 2020-08-11 MED ORDER — FAMOTIDINE 20 MG PO TABS
ORAL_TABLET | ORAL | Status: AC
  Administered 2020-08-11: 20 mg via ORAL
  Filled 2020-08-11: qty 1

## 2020-08-11 MED ORDER — FAMOTIDINE 20 MG PO TABS: 20.0000 mg | ORAL_TABLET | Freq: Once | ORAL | Status: AC

## 2020-08-11 MED ORDER — DEXMEDETOMIDINE (PRECEDEX) IN NS 20 MCG/5ML (4 MCG/ML) IV SYRINGE
PREFILLED_SYRINGE | INTRAVENOUS | Status: AC
  Filled 2020-08-11: qty 5

## 2020-08-11 MED ORDER — LACTATED RINGERS IV SOLN: INTRAVENOUS | Status: DC

## 2020-08-11 MED ORDER — DEXAMETHASONE SODIUM PHOSPHATE 10 MG/ML IJ SOLN
INTRAMUSCULAR | Status: DC | PRN
  Administered 2020-08-11: 10 mg via INTRAVENOUS

## 2020-08-11 MED ORDER — IPRATROPIUM-ALBUTEROL 0.5-2.5 (3) MG/3ML IN SOLN: 3.0000 mL | Freq: Once | RESPIRATORY_TRACT | Status: DC

## 2020-08-11 MED ORDER — FENTANYL CITRATE (PF) 100 MCG/2ML IJ SOLN: 25.0000 ug | INTRAMUSCULAR | Status: DC | PRN

## 2020-08-11 MED ORDER — IPRATROPIUM-ALBUTEROL 0.5-2.5 (3) MG/3ML IN SOLN
RESPIRATORY_TRACT | Status: AC
  Administered 2020-08-11: 3 mL via RESPIRATORY_TRACT
  Filled 2020-08-11: qty 3

## 2020-08-11 SURGICAL SUPPLY — 33 items
BAG DRAIN CYSTO-URO LG1000N (MISCELLANEOUS) ×2 IMPLANT
BAG DRN RND TRDRP ANRFLXCHMBR (UROLOGICAL SUPPLIES) ×1
BAG URINE DRAIN 2000ML AR STRL (UROLOGICAL SUPPLIES) ×2 IMPLANT
BASIN GRAD PLASTIC 32OZ STRL (MISCELLANEOUS) IMPLANT
BRUSH SCRUB EZ 1% IODOPHOR (MISCELLANEOUS) ×2 IMPLANT
CATH FOL 2WAY LX 16X5 (CATHETERS) ×2 IMPLANT
CATH FOLEY 2W COUNCIL 20FR 5CC (CATHETERS) ×2 IMPLANT
CATH SET URETHRAL DILATOR (CATHETERS) ×2 IMPLANT
CONRAY 43 FOR UROLOGY 50M (MISCELLANEOUS) ×2 IMPLANT
DRAPE 3/4 80X56 (DRAPES) ×2 IMPLANT
ELECT REM PT RETURN 9FT ADLT (ELECTROSURGICAL) ×2
ELECTRODE REM PT RTRN 9FT ADLT (ELECTROSURGICAL) ×1 IMPLANT
GLOVE BIOGEL PI IND STRL 7.5 (GLOVE) ×1 IMPLANT
GLOVE BIOGEL PI INDICATOR 7.5 (GLOVE) ×1
GOWN STRL REUS W/ TWL XL LVL3 (GOWN DISPOSABLE) ×2 IMPLANT
GOWN STRL REUS W/TWL XL LVL3 (GOWN DISPOSABLE) ×4
GUIDEWIRE STR DUAL SENSOR (WIRE) ×2 IMPLANT
HOLDER FOLEY CATH W/STRAP (MISCELLANEOUS) ×2 IMPLANT
KIT TURNOVER CYSTO (KITS) ×2 IMPLANT
MANIFOLD NEPTUNE II (INSTRUMENTS) ×2 IMPLANT
PACK CYSTO AR (MISCELLANEOUS) ×2 IMPLANT
SET CYSTO W/LG BORE CLAMP LF (SET/KITS/TRAYS/PACK) ×2 IMPLANT
SOL PREP PVP 2OZ (MISCELLANEOUS)
SOLUTION PREP PVP 2OZ (MISCELLANEOUS) IMPLANT
SURGILUBE 2OZ TUBE FLIPTOP (MISCELLANEOUS) ×2 IMPLANT
SYR 10ML LL (SYRINGE) ×2 IMPLANT
SYR 30ML LL (SYRINGE) ×2 IMPLANT
SYR TOOMEY 50ML (SYRINGE) IMPLANT
SYR TOOMEY IRRIG 70ML (MISCELLANEOUS) ×2
SYRINGE TOOMEY IRRIG 70ML (MISCELLANEOUS) ×1 IMPLANT
VALVE UROSEAL ADJ ENDO (VALVE) ×2 IMPLANT
WATER STERILE IRR 1000ML POUR (IV SOLUTION) ×2 IMPLANT
WATER STERILE IRR 3000ML UROMA (IV SOLUTION) ×2 IMPLANT

## 2020-08-11 NOTE — Anesthesia Preprocedure Evaluation (Signed)
Anesthesia Evaluation  Patient identified by MRN, date of birth, ID band Patient awake  General Assessment Comment:Appears frail  Reviewed: Allergy & Precautions, NPO status , Patient's Chart, lab work & pertinent test results  History of Anesthesia Complications Negative for: history of anesthetic complications  Airway Mallampati: III  TM Distance: >3 FB Neck ROM: Full    Dental  (+) Edentulous Upper, Edentulous Lower   Pulmonary neg pulmonary ROS, neg sleep apnea, neg COPD, Patient abstained from smoking.Not current smoker,    Pulmonary exam normal breath sounds clear to auscultation       Cardiovascular Exercise Tolerance: Poor METShypertension, +CHF  (-) CAD and (-) Past MI (-) dysrhythmias  Rhythm:Regular Rate:Normal - Systolic murmurs    Neuro/Psych negative neurological ROS  negative psych ROS   GI/Hepatic neg GERD  ,(+)     (-) substance abuse  ,   Endo/Other  neg diabetes  Renal/GU negative Renal ROS     Musculoskeletal   Abdominal   Peds  Hematology   Anesthesia Other Findings Past Medical History: No date: CHF (congestive heart failure) (HCC) No date: Complication of anesthesia     Comment:  had trouble waking up in 2014 No date: Enlarged prostate No date: Hyperlipidemia No date: Hypertension  Reproductive/Obstetrics                             Anesthesia Physical Anesthesia Plan  ASA: III  Anesthesia Plan: General   Post-op Pain Management:    Induction: Intravenous  PONV Risk Score and Plan: 3 and Ondansetron, Propofol infusion and TIVA  Airway Management Planned: Nasal Cannula  Additional Equipment: None  Intra-op Plan:   Post-operative Plan:   Informed Consent: I have reviewed the patients History and Physical, chart, labs and discussed the procedure including the risks, benefits and alternatives for the proposed anesthesia with the patient or  authorized representative who has indicated his/her understanding and acceptance.     Dental advisory given  Plan Discussed with: CRNA and Surgeon  Anesthesia Plan Comments: (Discussed risks of anesthesia with patient, including possibility of difficulty with spontaneous ventilation under anesthesia necessitating airway intervention, PONV, and rare risks such as cardiac or respiratory or neurological events. Patient understands.)        Anesthesia Quick Evaluation

## 2020-08-11 NOTE — Interval H&P Note (Signed)
History and Physical Interval Note: All questions were answered and he desires to proceed.  CV: RRR Lungs: Clear  08/11/2020 1:03 PM  James Mata  has presented today for surgery, with the diagnosis of bladder neck contracture.  The various methods of treatment have been discussed with the patient and family. After consideration of risks, benefits and other options for treatment, the patient has consented to  Procedure(s): CYSTOSCOPY (N/A) CYSTOSCOPY WITH URETHRAL DILATATION (N/A) CYSTOSCOPY WITH DIRECT VISION INTERNAL URETHROTOMY (N/A) as a surgical intervention.  The patient's history has been reviewed, patient examined, no change in status, stable for surgery.  I have reviewed the patient's chart and labs.  Questions were answered to the patient's satisfaction.     Scott C Stoioff

## 2020-08-11 NOTE — Discharge Instructions (Signed)
AMBULATORY SURGERY  DISCHARGE INSTRUCTIONS   1) The drugs that you were given will stay in your system until tomorrow so for the next 24 hours you should not:  A) Drive an automobile B) Make any legal decisions C) Drink any alcoholic beverage   2) You may resume regular meals tomorrow.  Today it is better to start with liquids and gradually work up to solid foods.  You may eat anything you prefer, but it is better to start with liquids, then soup and crackers, and gradually work up to solid foods.   3) Please notify your doctor immediately if you have any unusual bleeding, trouble breathing, redness and pain at the surgery site, drainage, fever, or pain not relieved by medication.    4) Additional Instructions:        Please contact your physician with any problems or Same Day Surgery at [phone], Monday through Friday 6 am to 4 pm, or [place] at [facility] number at [phone].  Call [facility] for catheter drainage problems or fever >101 degrees 2.  You will be contacted by our office for scheduling of transrectal ultrasound prostate and to schedule a urodynamic study

## 2020-08-11 NOTE — Op Note (Signed)
Preoperative diagnosis:  1. Urinary retention  Postoperative diagnosis:  1. Same  Procedure: 1. Cystoscopy with council catheter placement  Surgeon: Abbie Sons, MD  Anesthesia: MAC  Complications: None  Intraoperative findings:  Cystoscopy-urethra normal in caliber without stricture, torturous prostatic urethra with adenoma regrowth.  Bladder mucosa with moderate trabeculation.  Erythema bladder base secondary to indwelling Foley.  No definite solid or papillary lesions identified.  EBL: Minimal  Specimens: None  Indication: DECARLO RIVET is a 87 y.o. patient with recurrent urinary retention.  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 Junious Silk July 2021.  Recently has had difficulty with catheter placement with only a 14 French catheter able to be placed.  He is scheduled for cystoscopy to evaluate for recurrent stricture/bladder neck contracture.  After reviewing the management options for treatment, he elected to proceed with the above surgical procedure(s). We have discussed the potential benefits and risks of the procedure, side effects of the proposed treatment, the likelihood of the patient achieving the goals of the procedure, and any potential problems that might occur during the procedure or recuperation. Informed consent has been obtained.  Description of procedure:  The patient was taken to the operating room and sedation was obtained by anesthesia.  The patient was placed in the dorsal lithotomy position, prepped and draped in the usual sterile fashion, and preoperative antibiotics were administered. A preoperative time-out was performed.   A 14 French flexible cystoscope was lubricated, passed under direct vision and advanced into the bladder.  No stricture or bladder neck contracture was identified.  The prostatic urethra was tortuous as described above.  A Sensor wire was placed through the cystoscope and into the bladder.  The  cystoscope was removed and a 20 French Councill catheter was placed over the wire without difficulty.  The balloon was inflated with 10 cc of sterile water.  Catheter was irrigated which returned clear.  He was transported to the PACU in stable condition.  Plan:  Schedule outpatient transrectal ultrasound of the prostate for volume determination  Schedule urodynamic study    Abbie Sons, M.D.

## 2020-08-11 NOTE — Transfer of Care (Signed)
Immediate Anesthesia Transfer of Care Note  Patient: James Mata  Procedure(s) Performed: CYSTOSCOPY (N/A Urethra) CYSTOSCOPY WITH URETHRAL DILATATION (N/A Urethra) CYSTOSCOPY WITH DIRECT VISION INTERNAL URETHROTOMY (N/A Urethra)  Patient Location: PACU  Anesthesia Type:General  Level of Consciousness: sedated  Airway & Oxygen Therapy: Patient Spontanous Breathing and Patient connected to face mask oxygen  Post-op Assessment: Report given to RN and Post -op Vital signs reviewed and stable  Post vital signs: Reviewed and stable  Last Vitals:  Vitals Value Taken Time  BP 100/63 08/11/20 1332  Temp    Pulse 83 08/11/20 1335  Resp 33 08/11/20 1335  SpO2 98 % 08/11/20 1335  Vitals shown include unvalidated device data.  Last Pain:  Vitals:   08/11/20 1107  TempSrc: Oral  PainSc: 0-No pain         Complications: No complications documented.

## 2020-08-12 ENCOUNTER — Encounter: Payer: Self-pay | Admitting: Urology

## 2020-08-12 NOTE — Anesthesia Postprocedure Evaluation (Signed)
Anesthesia Post Note  Patient: James Mata  Procedure(s) Performed: CYSTOSCOPY (N/A Urethra) CYSTOSCOPY WITH URETHRAL DILATATION (N/A Urethra) CYSTOSCOPY WITH DIRECT VISION INTERNAL URETHROTOMY (N/A Urethra)  Patient location during evaluation: PACU Anesthesia Type: General Level of consciousness: awake and alert Pain management: pain level controlled Vital Signs Assessment: post-procedure vital signs reviewed and stable Respiratory status: spontaneous breathing, nonlabored ventilation, respiratory function stable and patient connected to nasal cannula oxygen Cardiovascular status: blood pressure returned to baseline and stable Postop Assessment: no apparent nausea or vomiting Anesthetic complications: no   No complications documented.   Last Vitals:  Vitals:   08/11/20 1547 08/11/20 1604  BP:    Pulse: (!) 103 (!) 104  Resp:    Temp:    SpO2: 96% 94%    Last Pain:  Vitals:   08/11/20 1527  TempSrc: Temporal  PainSc:                  Corinda Gubler

## 2020-08-14 LAB — CULTURE, URINE COMPREHENSIVE

## 2020-08-26 ENCOUNTER — Ambulatory Visit (INDEPENDENT_AMBULATORY_CARE_PROVIDER_SITE_OTHER): Payer: Medicare PPO | Admitting: Urology

## 2020-08-26 ENCOUNTER — Encounter: Payer: Self-pay | Admitting: Urology

## 2020-08-26 ENCOUNTER — Other Ambulatory Visit: Payer: Self-pay

## 2020-08-26 ENCOUNTER — Telehealth: Payer: Self-pay

## 2020-08-26 VITALS — BP 116/71 | HR 59 | Ht 67.0 in | Wt 160.0 lb

## 2020-08-26 DIAGNOSIS — R338 Other retention of urine: Secondary | ICD-10-CM | POA: Diagnosis not present

## 2020-08-26 DIAGNOSIS — N401 Enlarged prostate with lower urinary tract symptoms: Secondary | ICD-10-CM | POA: Diagnosis not present

## 2020-08-26 NOTE — Telephone Encounter (Signed)
Patient's daughter called stating that patient has decided to decline the Urodynamic study and does not wish to pursue any surgical options. He may be interested in a medication that will help shrink the prostate but otherwise is fine keeping the catheter. He was scheduled for a 1 month follow up cath exchange.

## 2020-08-27 ENCOUNTER — Encounter: Payer: Self-pay | Admitting: Urology

## 2020-08-27 ENCOUNTER — Ambulatory Visit: Payer: Self-pay | Admitting: Physician Assistant

## 2020-08-27 NOTE — Progress Notes (Signed)
08/26/2020 8:01 AM   James Mata June 23, 1931 [id number]  Referring provider: Alan Mulder, MD [address] [place],  [place] [address]  Chief Complaint  Patient presents with  . Other    Trus     HPI: 87 y.o. male presents for follow-up of urinary retention.  He was thought to have a recurrent stricture/bladder neck contracture however cystoscopy 08/11/2020 showed no stricture or BNC and he was noted to have a tortuous prostatic urethra with BPH.  Urodynamics and TRUS prostate for volume was recommended.  No complaints today  08/27/20  Chief Complaint  Patient presents with  . Other    Trus      HPI: 87 year old male with refractory urinary symptoms related to BPH who presents today to the office for cystoscopy and prostate sizing   PMH: Past Medical History:  Diagnosis Date  . CHF (congestive heart failure) (HCC)   . Complication of anesthesia    had trouble waking up in 2014  . Enlarged prostate   . Hyperlipidemia   . Hypertension     Surgical History: Past Surgical History:  Procedure Laterality Date  . CORONARY ANGIOPLASTY WITH STENT PLACEMENT    . CYSTOSCOPY N/A 08/11/2020   Procedure: CYSTOSCOPY;  Surgeon: Riki Altes, MD;  Location: [facility];  Service: Urology;  Laterality: N/A;  . CYSTOSCOPY WITH DIRECT VISION INTERNAL URETHROTOMY N/A 08/11/2020   Procedure: CYSTOSCOPY WITH DIRECT VISION INTERNAL URETHROTOMY;  Surgeon: Riki Altes, MD;  Location: [facility];  Service: Urology;  Laterality: N/A;  . CYSTOSCOPY WITH URETHRAL DILATATION N/A 08/11/2020   Procedure: CYSTOSCOPY WITH URETHRAL DILATATION;  Surgeon: Riki Altes, MD;  Location: [facility];  Service: Urology;  Laterality: N/A;  . HERNIA REPAIR    . PROSTATE ABLATION      Home Medications:  Allergies as of 08/26/2020   No Known Allergies     Medication List       Accurate as of August 26, 2020 11:59 PM. If you have any questions, ask your nurse or doctor.        STOP  taking these medications   potassium chloride SA 20 MEQ tablet Commonly known as: KLOR-CON Stopped by: Riki Altes, MD     TAKE these medications   aspirin 81 MG EC tablet Take 81 mg by mouth daily.   cyclobenzaprine 5 MG tablet Commonly known as: FLEXERIL Take 5 mg by mouth at bedtime.   doxazosin 4 MG tablet Commonly known as: CARDURA Take 4 mg by mouth at bedtime.   fluticasone 50 MCG/ACT nasal spray Commonly known as: FLONASE Place 1 spray into both nostrils daily as needed for allergies or rhinitis.   furosemide 80 MG tablet Commonly known as: LASIX Take 80 mg by mouth 2 (two) times daily.   isosorbide mononitrate 60 MG 24 hr tablet Commonly known as: IMDUR Take 30 mg by mouth daily.   metoprolol succinate 25 MG 24 hr tablet Commonly known as: TOPROL-XL Take 12.5 mg by mouth daily.   multivitamin with minerals tablet Take 1 tablet by mouth daily.   nitroGLYCERIN 0.4 MG SL tablet Commonly known as: NITROSTAT Place 0.4 mg under the tongue every 5 (five) minutes as needed for chest pain.   traMADol 50 MG tablet Commonly known as: ULTRAM Take 50 mg by mouth 2 (two) times daily as needed for severe pain.   trospium 20 MG tablet Commonly known as: SANCTURA Take 1 tablet (20 mg total) by mouth daily.  Allergies: No Known Allergies  Family History: History reviewed. No pertinent family history.  Social History:  reports that he has never smoked. He has never used smokeless tobacco. He reports previous alcohol use. He reports previous drug use.   Physical Exam: BP 116/71   Pulse (!) 59   Ht 5\' 7"  (1.702 m)   Wt 160 lb (72.6 kg)   BMI 25.06 kg/m   Constitutional:  Alert and oriented, No acute distress.  Prostate transrectal ultrasound sizing   Informed consent was obtained after discussing risks/benefits of the procedure.  A time out was performed to ensure correct patient identity.   Pre-Procedure: -Transrectal probe was placed without  difficulty -Transrectal Ultrasound performed revealing a 160 gm prostate measuring 6.15 x 7.58 x 6.57 cm (length) -No significant hypoechoic or median lobe noted    Assessment & Plan:    1.  BPH with urinary retention  Schedule urodynamics to assess for adequate detrusor contraction and if no evidence of significant detrusor hypocontractility will schedule appointment with Dr. or Dr. Apolinar Junes to discuss HoLEP.   Richardo Hanks, MD  [facility] [address] [place], [place] [phone]

## 2020-09-09 ENCOUNTER — Other Ambulatory Visit (HOSPITAL_COMMUNITY): Payer: Self-pay | Admitting: Endocrinology

## 2020-09-10 ENCOUNTER — Ambulatory Visit: Payer: Self-pay | Admitting: Physician Assistant

## 2020-09-16 ENCOUNTER — Other Ambulatory Visit: Payer: Self-pay

## 2020-09-16 ENCOUNTER — Ambulatory Visit: Payer: Medicare PPO | Admitting: Physician Assistant

## 2020-09-16 DIAGNOSIS — N401 Enlarged prostate with lower urinary tract symptoms: Secondary | ICD-10-CM

## 2020-09-16 DIAGNOSIS — R338 Other retention of urine: Secondary | ICD-10-CM

## 2020-09-16 MED ORDER — FINASTERIDE 5 MG PO TABS: 5.0000 mg | ORAL_TABLET | Freq: Every day | ORAL | 11 refills | Status: DC

## 2020-09-16 NOTE — Progress Notes (Signed)
Cath Change/ Replacement  Patient is present today for a catheter change due to urinary retention.  69ml of water was removed from the balloon, a 20FR Council tip foley cath was removed with out difficulty.  Patient was cleaned and prepped in a sterile fashion with betadine and 2% lidocaine jelly was instilled into the urethra. A 16 FR straight foley cath was inserted but was unable to be advanced past the level of the prostate. It was removed, and subsequently a 16 Fr coude Foley catheter was inserted successfully into the bladder no complications were noted Urine return was noted 52ml and urine was yellow in color. The balloon was filled with 81ml of sterile water. A night bag was attached for drainage.      Performed by: Carman Ching, PA-C   Additional notes: Starting finasteride 5mg  daily today with plans for voiding trial in 6 months, around August 2022. Patient reiterates that he does not wish to undergo urodynamics as he would not be interested in surgery. We discussed erectile dysfunction as a side effect of finasteride, patient agrees to proceed.  Follow up: Return in about 4 weeks (around 10/14/2020) for Catheter exchange.

## 2020-10-14 ENCOUNTER — Ambulatory Visit: Payer: Medicare PPO | Admitting: Physician Assistant

## 2020-10-14 ENCOUNTER — Other Ambulatory Visit: Payer: Self-pay

## 2020-10-14 DIAGNOSIS — R339 Retention of urine, unspecified: Secondary | ICD-10-CM

## 2020-10-14 NOTE — Progress Notes (Signed)
Cath Change/ Replacement  Patient is present today for a catheter change due to urinary retention.  46ml of water was removed from the balloon, a 16FR coude foley cath was removed without difficulty.  Patient was cleaned and prepped in a sterile fashion with betadine. A 16 FR coude foley cath was replaced into the bladder no complications were noted Urine return was noted 20ml and urine was pink in color. The balloon was filled with 40ml of sterile water. A night bag was attached for drainage.     Performed by: Carman Ching, PA-C   Follow up: Return in about 4 weeks (around 11/11/2020) for Catheter exchange.

## 2020-11-11 ENCOUNTER — Other Ambulatory Visit: Payer: Self-pay

## 2020-11-11 ENCOUNTER — Ambulatory Visit: Payer: Medicare PPO | Admitting: Physician Assistant

## 2020-11-11 DIAGNOSIS — R339 Retention of urine, unspecified: Secondary | ICD-10-CM | POA: Diagnosis not present

## 2020-11-11 NOTE — Progress Notes (Signed)
Cath Change/ Replacement  Patient is present today for a catheter change due to urinary retention.  30ml of water was removed from the balloon, a 16FR coude foley cath was removed without difficulty.  Patient was cleaned and prepped in a sterile fashion with betadine and 2% lidocaine jelly was instilled into the urethra. A 16 FR coude foley cath was replaced into the bladder no complications were noted Urine return was noted 53ml and urine was yellow in color. The balloon was filled with 7ml of sterile water. A night bag was attached for drainage.      Performed by: Carman Ching, PA-C   Additional notes: Patient reports constipation this month without bladder spasms. Counseled him to stop trospium x1 month, start Miralax, and continue finasteride. Will recheck sx at next visit and consider resuming trospium if necessary.  Follow up: Return in about 4 weeks (around 12/09/2020) for Catheter exchange.

## 2020-12-09 ENCOUNTER — Ambulatory Visit: Payer: Medicare PPO | Admitting: Physician Assistant

## 2020-12-09 ENCOUNTER — Other Ambulatory Visit: Payer: Self-pay

## 2020-12-09 DIAGNOSIS — R339 Retention of urine, unspecified: Secondary | ICD-10-CM | POA: Diagnosis not present

## 2020-12-09 NOTE — Progress Notes (Signed)
Cath Change/ Replacement  Patient is present today for a catheter change due to urinary retention.  40ml of water was removed from the balloon, a 16FR coude foley cath was removed without difficulty.  Patient was cleaned and prepped in a sterile fashion with betadine and 2% lidocaine jelly was instilled into the urethra. A 16 FR coude foley cath was replaced into the bladder no complications were noted Urine return was noted 9ml and urine was yellow in color. The balloon was filled with 30ml of sterile water. A night bag was attached for drainage.    Performed by: Carman Ching, PA-C   Additional notes: Patient stopped trospium as instructed, but noticed increased bladder spasms. He took a laxative to resolve his constipation and restarted trospium. He also notes urinary odor and sediment, without lower abdominal pain, low back pain, fever, chills, nausea, or vomiting. Catheter not occluded. Counseled him to stay hydrated and change his bags as they become odorous.  Follow up: Return in about 4 weeks (around 01/06/2021) for Catheter exchange.

## 2021-01-11 ENCOUNTER — Ambulatory Visit: Payer: Medicare PPO | Admitting: Physician Assistant

## 2021-01-11 ENCOUNTER — Other Ambulatory Visit: Payer: Self-pay

## 2021-01-11 DIAGNOSIS — R339 Retention of urine, unspecified: Secondary | ICD-10-CM

## 2021-01-11 NOTE — Progress Notes (Signed)
Cath Change/ Replacement  Patient is present today for a catheter change due to urinary retention.  89ml of water was removed from the balloon, a 16FR coude foley cath was removed without difficulty.  Patient was cleaned and prepped in a sterile fashion with betadine. A 16 FR coude foley cath was replaced into the bladder no complications were noted Urine return was noted 65ml and urine was yellow in color. The balloon was filled with 15ml of sterile water. A night bag was attached for drainage.  Patient tolerated well.    Performed by: Carman Ching, PA-C   Follow up: Return in about 4 weeks (around 02/08/2021) for Catheter exchange.

## 2021-02-05 ENCOUNTER — Other Ambulatory Visit: Payer: Self-pay

## 2021-02-05 ENCOUNTER — Ambulatory Visit: Payer: Medicare PPO | Admitting: Physician Assistant

## 2021-02-05 DIAGNOSIS — R338 Other retention of urine: Secondary | ICD-10-CM | POA: Diagnosis not present

## 2021-02-05 DIAGNOSIS — T83198A Other mechanical complication of other urinary devices and implants, initial encounter: Secondary | ICD-10-CM

## 2021-02-05 NOTE — Progress Notes (Signed)
Cath Change/ Replacement  Patient is present today for a catheter change due to urinary retention.  46ml of water was removed from the balloon, a 16FR coude foley cath was removed without difficulty.  Patient was cleaned and prepped in a sterile fashion with betadine and 2% lidocaine jelly was instilled into the urethra. A 16 FR coude foley cath was replaced into the bladder no complications were noted Urine return was noted and urine was yellow in color. The balloon was filled with 11ml of sterile water. A night bag was attached for drainage.  Patient tolerated well.    Performed by: Carman Ching, PA-C   Additional notes: Patient presented to clinic with 12 hours of nondraining Foley catheter. Night bag tubing with sediment coating the lumen consistent with sediment occlusion. Patient denies lower abdominal pain, low back pain, fever, chills, nausea, or vomiting. Foley exchanged without difficulty today; will plan for VT next month as he will have been on finasteride x6 months at that point.  Follow up: Return in about 4 weeks (around 03/05/2021) for Voiding trial.

## 2021-02-08 ENCOUNTER — Ambulatory Visit: Payer: Self-pay | Admitting: Physician Assistant

## 2021-02-19 ENCOUNTER — Encounter: Payer: Self-pay | Admitting: Physician Assistant

## 2021-02-19 ENCOUNTER — Other Ambulatory Visit: Payer: Self-pay

## 2021-02-19 ENCOUNTER — Ambulatory Visit (INDEPENDENT_AMBULATORY_CARE_PROVIDER_SITE_OTHER): Payer: Medicare PPO | Admitting: Physician Assistant

## 2021-02-19 VITALS — BP 148/78 | HR 75 | Ht 67.0 in | Wt 155.0 lb

## 2021-02-19 DIAGNOSIS — R338 Other retention of urine: Secondary | ICD-10-CM | POA: Diagnosis not present

## 2021-02-19 LAB — BLADDER SCAN AMB NON-IMAGING

## 2021-02-19 NOTE — Progress Notes (Signed)
Cath Change/ Replacement  Patient is present today for a catheter change due to reports of nondraining Foley catheter.  He was bladder scanned on arrival with a volume of 418 mL.  9ml of water was removed from the balloon, a 16FR coud foley cath was removed without difficulty.  Crystals were noted throughout the drainage tubing and at the catheter tip.  Patient was cleaned and prepped in a sterile fashion with betadine and 2% lidocaine jelly was instilled into the urethra. A 16 FR coud foley cath was replaced into the bladder no complications were noted Urine return was noted 450ml and urine was yellow in color. The balloon was filled with 10ml of sterile water. A night bag was attached for drainage.  Patient tolerated well.    Performed by: Samantha Vaillancourt, PA-C   Additional notes: Patient is curious about starting bladder installations to reduce his risk for encrustation, which is reasonable.  We discussed vinegar solution installations to reduce urinary encrustation.  I provided the patient with an irrigation kit, catheter plug, and 500 mL of sterile water today as well as written instructions on how to perform medical installations and mix vinegar solution at home.  He expressed understanding.  Follow up: 2 weeks for previously scheduled voiding trial  Results for orders placed or performed in visit on 02/19/21  BLADDER SCAN AMB NON-IMAGING  Result Value Ref Range   Scan Result 418ml    

## 2021-02-19 NOTE — Patient Instructions (Signed)
Vinegar Bladder Irrigation Protocol  Please start irrigating your bladder with a vinegar solution as described below to reduce urinary encrustation.  Most grocery stores carry white vinegar as a 5% solution. To make an appropriate bladder irrigation solution, you will need to dilute this at a ratio of roughly 20:1.  To achieve this, see the chart below to determine what amount of 5% white vinegar solution you should mix with your normal bladder irrigation (homemade saline or sterile saline from the pharmacy).  If you are starting with a saline volume of... ...250mL, then add 2.5 tsp (12.5mL) of white vinegar ...500mL, then add 5 tsp (25mL) of white vinegar ...1000mL, then add 10 tsp (50mL) of white vinegar ...1 gallon, then add about 6 oz of white vinegar  Instill 30mL of the vinegar solution through your catheter into the bladder three times daily. Leave the solution in the bladder for 10 minutes each time, then drain. Discontinue irrigation if it causes pain or discomfort. You may store any leftover solution in the refrigerator for up to 1 week.  

## 2021-03-08 ENCOUNTER — Ambulatory Visit: Payer: Self-pay | Admitting: Physician Assistant

## 2021-03-09 ENCOUNTER — Ambulatory Visit: Payer: Self-pay | Admitting: Physician Assistant

## 2021-03-09 ENCOUNTER — Other Ambulatory Visit: Payer: Self-pay

## 2021-03-09 ENCOUNTER — Ambulatory Visit: Payer: Medicare PPO | Admitting: Physician Assistant

## 2021-03-09 DIAGNOSIS — R339 Retention of urine, unspecified: Secondary | ICD-10-CM | POA: Diagnosis not present

## 2021-03-09 LAB — BLADDER SCAN AMB NON-IMAGING: SCA Result: 816

## 2021-03-09 NOTE — Progress Notes (Signed)
03/09/2021 9:19 AM   James Mata 04-27-1931 [id number]  CC: Chief Complaint  Patient presents with   Urinary Retention   HPI: James Mata is a 87 y.o. male with PMH bladder contracture and urinary retention managed with chronic indwelling Foley catheter who presents today for voiding trial after starting finasteride approximately 6 months ago.   He reports no acute concerns today.  He is excited to proceed with voiding trial.  Foley catheter removed in clinic this morning, see procedure note below for more information.  Patient returned to clinic in the afternoon for PVR.  He reports drinking 60oz of fluid.  He has been able to urinate about .  PVR . He reports mild bladder discomfort.  PMH: Past Medical History:  Diagnosis Date   CHF (congestive heart failure) (HCC)    Complication of anesthesia    had trouble waking up in 2014   Enlarged prostate    Hyperlipidemia    Hypertension     Surgical History: Past Surgical History:  Procedure Laterality Date   CORONARY ANGIOPLASTY WITH STENT PLACEMENT     CYSTOSCOPY N/A 08/11/2020   Procedure: CYSTOSCOPY;  Surgeon: Riki Altes, MD;  Location: [facility];  Service: Urology;  Laterality: N/A;   CYSTOSCOPY WITH DIRECT VISION INTERNAL URETHROTOMY N/A 08/11/2020   Procedure: CYSTOSCOPY WITH DIRECT VISION INTERNAL URETHROTOMY;  Surgeon: Riki Altes, MD;  Location: [facility];  Service: Urology;  Laterality: N/A;   CYSTOSCOPY WITH URETHRAL DILATATION N/A 08/11/2020   Procedure: CYSTOSCOPY WITH URETHRAL DILATATION;  Surgeon: Riki Altes, MD;  Location: [facility];  Service: Urology;  Laterality: N/A;   HERNIA REPAIR     PROSTATE ABLATION      Home Medications:  Allergies as of 03/09/2021   No Known Allergies      Medication List        Accurate as of March 09, 2021  9:19 AM. If you have any questions, ask your nurse or doctor.          aspirin 81 MG EC tablet Take 81 mg by mouth daily.    cyclobenzaprine 5 MG tablet Commonly known as: FLEXERIL Take 5 mg by mouth at bedtime.   doxazosin 4 MG tablet Commonly known as: CARDURA Take 4 mg by mouth at bedtime.   finasteride 5 MG tablet Commonly known as: PROSCAR Take 1 tablet (5 mg total) by mouth daily.   fluticasone 50 MCG/ACT nasal spray Commonly known as: FLONASE Place 1 spray into both nostrils daily as needed for allergies or rhinitis.   furosemide 80 MG tablet Commonly known as: LASIX Take 80 mg by mouth 2 (two) times daily.   isosorbide mononitrate 60 MG 24 hr tablet Commonly known as: IMDUR Take 30 mg by mouth daily.   metoprolol succinate 25 MG 24 hr tablet Commonly known as: TOPROL-XL Take 12.5 mg by mouth daily.   Molnupiravir 200 MG Caps TAKE 4 CAPSULES BY MOUTH EVERY 12 HOURS FOR 5 DAYS. TAKE WITH OR WITHOUT FOOD.   multivitamin with minerals tablet Take 1 tablet by mouth daily.   nitroGLYCERIN 0.4 MG SL tablet Commonly known as: NITROSTAT Place 0.4 mg under the tongue every 5 (five) minutes as needed for chest pain.   traMADol 50 MG tablet Commonly known as: ULTRAM Take 50 mg by mouth 2 (two) times daily as needed for severe pain.   trospium 20 MG tablet Commonly known as: SANCTURA Take 1 tablet (20 mg total) by mouth daily.  Allergies:  No Known Allergies  Family History: No family history on file.  Social History:   reports that he has never smoked. He has never used smokeless tobacco. He reports that he does not currently use alcohol. He reports that he does not currently use drugs.  Physical Exam: There were no vitals taken for this visit.  Constitutional:  Alert and oriented, no acute distress, nontoxic appearing HEENT: [place], AT Cardiovascular: No clubbing, cyanosis, or edema Respiratory: Normal respiratory effort, no increased work of breathing Skin: No rashes, bruises or suspicious lesions Neurologic: Grossly intact, no focal deficits, moving all 4  extremities Psychiatric: Normal mood and affect  Laboratory Data: Results for orders placed or performed in visit on 03/09/21  BLADDER SCAN AMB NON-IMAGING  Result Value Ref Range   SCA Result 816    Catheter Removal  Patient is present today for a catheter removal.  36ml of water was drained from the balloon. A 16FR coude foley cath was removed from the bladder no complications were noted. Patient tolerated well.  Performed by: Carman Ching, PA-C   Follow up/ Additional notes: Push fluids and RTC this afternoon for PVR.   Assessment & Plan:   1. Urinary retention Voiding trial failed today, though he was able to void more than he has been in the past. Offered patient Foley replacement versus CIC teaching and he elected for the former, see procedure note below. Will plan to continue finasteride and repeat voiding trial in January 2023. - BLADDER SCAN AMB NON-IMAGING   Simple Catheter Placement  Due to urinary retention patient is present today for a foley cath placement.  Patient was cleaned and prepped in a sterile fashion with betadine and 2% lidocaine jelly was instilled into the urethra. A 16 FR coude foley catheter was inserted, urine return was noted  , urine was yellow in color.  The balloon was filled with 10cc of sterile water.  A night bag was attached for drainage. Patient tolerated well.  Performed by: Carman Ching, PA-C   Return in about 4 weeks (around 04/06/2021) for Catheter exchange.  Carman Ching, PA-C  [facility] [address] [place], [place] [address] [phone]

## 2021-04-02 ENCOUNTER — Other Ambulatory Visit: Payer: Self-pay

## 2021-04-02 ENCOUNTER — Ambulatory Visit: Payer: Medicare PPO | Admitting: Physician Assistant

## 2021-04-02 ENCOUNTER — Ambulatory Visit: Payer: Medicare PPO | Admitting: *Deleted

## 2021-04-02 DIAGNOSIS — T83198A Other mechanical complication of other urinary devices and implants, initial encounter: Secondary | ICD-10-CM

## 2021-04-02 DIAGNOSIS — R338 Other retention of urine: Secondary | ICD-10-CM

## 2021-04-02 NOTE — Progress Notes (Signed)
Patient ID: James Mata, male   DOB: 02-04-1931, 87 y.o.   MRN: [id number] Bladder Irrigation  Due to encrustation patient is present today for a bladder irrigation. Patient was cleaned and prepped in a sterile fashion. 180 ml of saline/sterile water was instilled and irrigated into the bladder with a 60ml To[place]y syringe through the catheter in place.  After irrigation urine flow was noted no complications were noted catheter is now draining fine.  Catheter was reattached to the night bag for drainage. Patient tolerated well.   Preformed by: Mervin Hack, CMA Additional notes/ Follow up: pt tolerated well

## 2021-04-09 ENCOUNTER — Telehealth: Payer: Self-pay

## 2021-04-09 NOTE — Telephone Encounter (Signed)
Patient daughter called stating his catheter was clogged and they were able to irrigate it on their own. Daughter states she did not save the supplies and would like more incase it happens over the weekend. Daughter states the catheter is draining and pt is not having any symptoms.

## 2021-04-12 ENCOUNTER — Other Ambulatory Visit: Payer: Self-pay

## 2021-04-12 ENCOUNTER — Ambulatory Visit: Payer: Medicare PPO | Admitting: Physician Assistant

## 2021-04-12 DIAGNOSIS — R339 Retention of urine, unspecified: Secondary | ICD-10-CM | POA: Diagnosis not present

## 2021-04-12 NOTE — Progress Notes (Signed)
Cath Change/ Replacement  Patient is present today for a catheter change due to urinary retention.  67ml of water was removed from the balloon, a 16FR coude foley cath was removed without difficulty.  Patient was cleaned and prepped in a sterile fashion with betadine and 2% lidocaine jelly was instilled into the urethra. A 18 FR coude foley cath was replaced into the bladder no complications were noted Urine return was noted 82ml and urine was yellow in color. The balloon was filled with 17ml of sterile water. A night bag was attached for drainage.  Patient tolerated well.    Performed by: Carman Ching, PA-C   Additional notes: Intermittent catheter obstruction x5 days that cleared with manual irrigation at home. Upsized to 18Fr to increase lumen diameter. Consider switching to silicone in the future if it persists.  Follow up: Return in about 4 weeks (around 05/10/2021) for Catheter exchange.

## 2021-05-10 ENCOUNTER — Ambulatory Visit: Payer: Medicare PPO | Admitting: Physician Assistant

## 2021-05-10 ENCOUNTER — Other Ambulatory Visit: Payer: Self-pay

## 2021-05-10 DIAGNOSIS — R339 Retention of urine, unspecified: Secondary | ICD-10-CM

## 2021-05-10 NOTE — Progress Notes (Signed)
Cath Change/ Replacement  Patient is present today for a catheter change due to urinary retention.  53ml of water was removed from the balloon, a 18FR coude foley cath was removed without difficulty.  Patient was cleaned and prepped in a sterile fashion with betadine and 2% lidocaine jelly was instilled into the urethra. A 16 FR coude foley cath was replaced into the bladder no complications were noted Urine return was noted 7ml and urine was yellow in color. The balloon was filled with 43ml of sterile water. A night bag was attached for drainage.  Patient tolerated well.    Performed by: Carman Ching, PA-C   Follow up: Return in about 4 weeks (around 06/07/2021) for Catheter exchange.

## 2021-05-23 ENCOUNTER — Other Ambulatory Visit: Payer: Self-pay

## 2021-05-23 ENCOUNTER — Encounter: Payer: Self-pay | Admitting: Emergency Medicine

## 2021-05-23 ENCOUNTER — Emergency Department
Admission: EM | Admit: 2021-05-23 | Discharge: 2021-05-23 | Disposition: A | Discharge status: Home / Self Care | Payer: Medicare PPO | Attending: Emergency Medicine | Admitting: Emergency Medicine

## 2021-05-23 DIAGNOSIS — Z79899 Other long term (current) drug therapy: Secondary | ICD-10-CM | POA: Diagnosis not present

## 2021-05-23 DIAGNOSIS — T83091A Other mechanical complication of indwelling urethral catheter, initial encounter: Secondary | ICD-10-CM | POA: Insufficient documentation

## 2021-05-23 DIAGNOSIS — Z7982 Long term (current) use of aspirin: Secondary | ICD-10-CM | POA: Insufficient documentation

## 2021-05-23 DIAGNOSIS — I110 Hypertensive heart disease with heart failure: Secondary | ICD-10-CM | POA: Diagnosis not present

## 2021-05-23 DIAGNOSIS — R339 Retention of urine, unspecified: Secondary | ICD-10-CM | POA: Diagnosis present

## 2021-05-23 DIAGNOSIS — I509 Heart failure, unspecified: Secondary | ICD-10-CM | POA: Diagnosis not present

## 2021-05-23 DIAGNOSIS — N3001 Acute cystitis with hematuria: Secondary | ICD-10-CM | POA: Diagnosis not present

## 2021-05-23 LAB — URINALYSIS, COMPLETE (UACMP) WITH MICROSCOPIC
Bilirubin Urine: NEGATIVE
Glucose, UA: NEGATIVE mg/dL
Ketones, ur: NEGATIVE mg/dL
Nitrite: NEGATIVE
Protein, ur: 100 mg/dL — AB
RBC / HPF: >50 RBC/hpf — ABNORMAL HIGH (ref 0–5)
Specific Gravity, Urine: 1.011 (ref 1.005–1.030)
Squamous Epithelial / HPF: NONE SEEN (ref 0–5)
WBC, UA: >50 WBC/hpf — ABNORMAL HIGH (ref 0–5)
pH: 6 (ref 5.0–8.0)

## 2021-05-23 MED ORDER — LIDOCAINE HCL URETHRAL/MUCOSAL 2 % EX GEL
1.0000 "application " | Freq: Once | CUTANEOUS | Status: AC
  Administered 2021-05-23: 1 via URETHRAL
  Filled 2021-05-23: qty 6

## 2021-05-23 MED ORDER — CIPROFLOXACIN HCL 250 MG PO TABS: 250.0000 mg | ORAL_TABLET | Freq: Two times a day (BID) | ORAL | 0 refills | Status: AC

## 2021-05-23 NOTE — ED Notes (Signed)
1 failed attempt to place a 6fr catheter

## 2021-05-23 NOTE — ED Notes (Signed)
2nd failed attempt, with 38fr coude catheter

## 2021-05-23 NOTE — ED Triage Notes (Addendum)
Pt in via POV; reports catheter is not draining since approximately 0430 today; son-n-law states he has attempted to irrigate catheter without success.  Pt denies any pain, states, "It just feels like I got to pee."  Reports catheter has been replaced approximately 2 weeks ago.  Vitals WDL, NAD noted at this time.

## 2021-05-23 NOTE — ED Provider Notes (Signed)
[facility] Emergency Department Provider Note    Event Date/Time   First MD Initiated Contact with Patient 05/23/21 [phone]     (approximate)  I have reviewed the triage vital signs and the nursing notes.   HISTORY  Chief Complaint Urinary Retention    HPI James Mata is a 87 y.o. male the below listed past medical history presents to the ER for evaluation of urinary retention and clogged Foley catheter.  Last exchange was 2 weeks ago.  Does have the urge to urinate.  Has not noted any blood in his urine but has noted darker colored urine with sediment.  Past Medical History:  Diagnosis Date   CHF (congestive heart failure) (HCC)    Complication of anesthesia    had trouble waking up in 2014   Enlarged prostate    Hyperlipidemia    Hypertension    No family history on file. Past Surgical History:  Procedure Laterality Date   CORONARY ANGIOPLASTY WITH STENT PLACEMENT     CYSTOSCOPY N/A 08/11/2020   Procedure: CYSTOSCOPY;  Surgeon: Riki Altes, MD;  Location: [facility] ORS;  Service: Urology;  Laterality: N/A;   CYSTOSCOPY WITH DIRECT VISION INTERNAL URETHROTOMY N/A 08/11/2020   Procedure: CYSTOSCOPY WITH DIRECT VISION INTERNAL URETHROTOMY;  Surgeon: Riki Altes, MD;  Location: [facility] ORS;  Service: Urology;  Laterality: N/A;   CYSTOSCOPY WITH URETHRAL DILATATION N/A 08/11/2020   Procedure: CYSTOSCOPY WITH URETHRAL DILATATION;  Surgeon: Riki Altes, MD;  Location: [facility] ORS;  Service: Urology;  Laterality: N/A;   HERNIA REPAIR     PROSTATE ABLATION     There are no problems to display for this patient.     Prior to Admission medications   Medication Sig Start Date End Date Taking? Authorizing Provider  ciprofloxacin (CIPRO) 250 MG tablet Take 1 tablet (250 mg total) by mouth 2 (two) times daily for 5 days. 05/23/21 05/28/21 Yes Willy Eddy, MD  aspirin 81 MG EC tablet Take 81 mg by mouth daily.    [provider]   cyclobenzaprine (FLEXERIL) 5 MG tablet Take 5 mg by mouth at bedtime. 02/04/20   [provider]  doxazosin (CARDURA) 4 MG tablet Take 4 mg by mouth at bedtime. 12/30/19   [provider]  finasteride (PROSCAR) 5 MG tablet Take 1 tablet (5 mg total) by mouth daily. 09/16/20   Vaillancourt, Lelon Mast, PA-C  fluticasone (FLONASE) 50 MCG/ACT nasal spray Place 1 spray into both nostrils daily as needed for allergies or rhinitis.    [provider]  furosemide (LASIX) 80 MG tablet Take 80 mg by mouth 2 (two) times daily. 12/21/19   [provider]  isosorbide mononitrate (IMDUR) 60 MG 24 hr tablet Take 30 mg by mouth daily. 12/21/19   [provider]  metoprolol succinate (TOPROL-XL) 25 MG 24 hr tablet Take 12.5 mg by mouth daily. 12/21/19   [provider]  Molnupiravir 200 MG CAPS TAKE 4 CAPSULES BY MOUTH EVERY 12 HOURS FOR 5 DAYS. TAKE WITH OR WITHOUT FOOD. 09/09/20 09/09/21  Morayati, Delsa Sale, MD  Multiple Vitamins-Minerals (MULTIVITAMIN WITH MINERALS) tablet Take 1 tablet by mouth daily.    [provider]  nitroGLYCERIN (NITROSTAT) 0.4 MG SL tablet Place 0.4 mg under the tongue every 5 (five) minutes as needed for chest pain. 02/04/20   [provider]  traMADol (ULTRAM) 50 MG tablet Take 50 mg by mouth 2 (two) times daily as needed for severe pain. 01/01/20  [provider]  trospium (SANCTURA) 20 MG tablet Take 1 tablet (20 mg total) by mouth daily. 07/13/20   Carman Ching, PA-C    Allergies Patient has no known allergies.    Social History Social History   Tobacco Use   Smoking status: Never   Smokeless tobacco: Never  Vaping Use   Vaping Use: Never used  Substance Use Topics   Alcohol use: Not Currently   Drug use: Not Currently    Review of Systems Patient denies headaches, rhinorrhea, blurry vision, numbness, shortness of breath, chest pain, edema, cough, abdominal pain, nausea, vomiting,  diarrhea, dysuria, fevers, rashes or hallucinations unless otherwise stated above in HPI. ____________________________________________   PHYSICAL EXAM:  VITAL SIGNS: Vitals:   05/23/21 0642 05/23/21 1122  BP: (!) 144/82 122/68  Pulse: 85 78  Resp: 20 16  Temp: 98.3 F (36.8 C)   SpO2: 92% 93%    Constitutional: Alert and oriented.  Eyes: Conjunctivae are normal.  Head: Atraumatic. Nose: No congestion/rhinnorhea. Mouth/Throat: Mucous membranes are moist.   Neck: No stridor. Painless ROM.  Cardiovascular: Normal rate, regular rhythm. Grossly normal heart sounds.  Good peripheral circulation. Respiratory: Normal respiratory effort.  No retractions. Lungs CTAB. Gastrointestinal: Soft and nontender. No distention. Genitourinary: normal external genitalia Musculoskeletal: No lower extremity tenderness nor edema.  No joint effusions. Neurologic:  Normal speech and language. No gross focal neurologic deficits are appreciated. No facial droop Skin:  Skin is warm, dry and intact. No rash noted. Psychiatric: Mood and affect are normal. Speech and behavior are normal.  ____________________________________________   LABS (all labs ordered are listed, but only abnormal results are displayed)  Results for orders placed or performed during the hospital encounter of 05/23/21 (from the past 24 hour(s))  Urinalysis, Complete w Microscopic     Status: Abnormal   Collection Time: 05/23/21 10:57 AM  Result Value Ref Range   Color, Urine YELLOW (A) YELLOW   APPearance CLOUDY (A) CLEAR   Specific Gravity, Urine 1.011 1.005 - 1.030   pH 6.0 5.0 - 8.0   Glucose, UA NEGATIVE NEGATIVE mg/dL   Hgb urine dipstick LARGE (A) NEGATIVE   Bilirubin Urine NEGATIVE NEGATIVE   Ketones, ur NEGATIVE NEGATIVE mg/dL   Protein, ur 607 (A) NEGATIVE mg/dL   Nitrite NEGATIVE NEGATIVE   Leukocytes,Ua LARGE (A) NEGATIVE   RBC / HPF >50 (H) 0 - 5 RBC/hpf   WBC, UA >50 (H) 0 - 5 WBC/hpf   Bacteria, UA MANY (A)  NONE SEEN   Squamous Epithelial / LPF NONE SEEN 0 - 5   WBC Clumps PRESENT    Mucus PRESENT    Hyaline Casts, UA PRESENT    ____________________________________________ ____________________________________________  RADIOLOGY   ____________________________________________   PROCEDURES  Procedure(s) performed:  Procedures    Critical Care performed: no ____________________________________________   INITIAL IMPRESSION / ASSESSMENT AND PLAN / ED COURSE  Pertinent labs & imaging results that were available during my care of the patient were reviewed by me and considered in my medical decision making (see chart for details).   DDX: Urinary retention, hematuria, UTI, displaced Foley  James Mata is a 87 y.o. who presents to the ED with urinary retention secondary to clogged Foley catheter.  Urine with quite a bit of purulence and sediment in the catheter.  Was unable to reinsert a 65 [place] or 56 [place] coud but was able to safely pass a 14 French catheter with appropriate placement confirmed by bedside ultrasound.  He is  draining urine with sediment urinalysis that does show bacteria.  Based on his previous urine culture reports will put on Cipro which she has tolerated in the past.  No systemic symptoms to suggest pyelo or sepsis.  Discussed the case in consultation with Dr. Pete Glatter of urology who agrees with plan for close outpatient follow-up for possible catheter exchange.  Discussed need for close outpatient follow-up.  Patient and family agreeable to plan.     The patient was evaluated in Emergency Department today for the symptoms described in the history of present illness. He/she was evaluated in the context of the global COVID-19 pandemic, which necessitated consideration that the patient might be at risk for infection with the SARS-CoV-2 virus that causes COVID-19. Institutional protocols and algorithms that pertain to the evaluation of patients at risk for COVID-19  are in a state of rapid change based on information released by regulatory bodies including the CDC and federal and state organizations. These policies and algorithms were followed during the patient's care in the ED.  As part of my medical decision making, I reviewed the following data within the electronic MEDICAL RECORD NUMBER Nursing notes reviewed and incorporated, Labs reviewed, notes from prior ED visits and [place] Controlled Substance Database   ____________________________________________   FINAL CLINICAL IMPRESSION(S) / ED DIAGNOSES  Final diagnoses:  Obstruction of Foley catheter, initial encounter (HCC)  Acute cystitis with hematuria      NEW MEDICATIONS STARTED DURING THIS VISIT:  New Prescriptions   CIPROFLOXACIN (CIPRO) 250 MG TABLET    Take 1 tablet (250 mg total) by mouth 2 (two) times daily for 5 days.     Note:  This document was prepared using Dragon voice recognition software and may include unintentional dictation errors.    Willy Eddy, MD 05/23/21 1149

## 2021-05-23 NOTE — ED Provider Notes (Signed)
Emergency Medicine Provider Triage Evaluation Note  James Mata , a 87 y.o. male  was evaluated in triage.  Pt complains of urinary retention.  Patient has a chronic indwelling Foley catheter that stopped draining somewhere around midnight.  He started to have some lower abdominal discomfort.  States that he has his Foley catheter changed monthly in the urology office.  Family attempted to irrigate catheter without success.  Review of Systems  Positive: Abdominal discomfort, urinary retention Negative: Fevers, vomiting  Physical Exam  BP (!) 144/82 (BP Location: Left Arm)   Pulse 85   Temp 98.3 F (36.8 C) (Oral)   Resp 20   Ht 5\' 7"  (1.702 m)   Wt 70.3 kg   SpO2 92%   BMI 24.28 kg/m  Gen:   Awake, no distress   Resp:  Normal effort  MSK:   Moves extremities without difficulty  Other:  Nondistended abdomen, mildly tender in the suprapubic area  Medical Decision Making  Medically screening exam initiated at 6:53 AM.  Appropriate orders placed.  James Mata was informed that the remainder of the evaluation will be completed by another provider, this initial triage assessment does not replace that evaluation, and the importance of remaining in the ED until their evaluation is complete.  We will exchange patient's Foley catheter.   Jasn Xia, Annabell Howells, DO 05/23/21 [phone]

## 2021-05-23 NOTE — ED Notes (Signed)
Patient given discharge instructions, all questions answered. Patient in possession of all belongings, directed to the discharge area  

## 2021-05-24 ENCOUNTER — Ambulatory Visit: Payer: Medicare PPO | Admitting: Physician Assistant

## 2021-05-24 ENCOUNTER — Encounter: Payer: Self-pay | Admitting: Physician Assistant

## 2021-05-24 VITALS — BP 101/61 | HR 76

## 2021-05-24 DIAGNOSIS — R338 Other retention of urine: Secondary | ICD-10-CM

## 2021-05-24 DIAGNOSIS — R972 Elevated prostate specific antigen [PSA]: Secondary | ICD-10-CM | POA: Diagnosis not present

## 2021-05-24 LAB — URINE CULTURE: Special Requests: NORMAL

## 2021-05-24 NOTE — Patient Instructions (Signed)
Vinegar Bladder Irrigation Protocol  Please start irrigating your bladder with a vinegar solution as described below to reduce urinary encrustation.  Most grocery stores carry white vinegar as a 5% solution. To make an appropriate bladder irrigation solution, you will need to dilute this at a ratio of roughly 20:1.  To achieve this, see the chart below to determine what amount of 5% white vinegar solution you should mix with your normal bladder irrigation (homemade saline or sterile saline from the pharmacy).  If you are starting with a saline volume of... ... , then add 2.5 tsp (12.20mL) of white vinegar .Marland KitchenMarland Kitchen , then add 5 tsp (100mL) of white vinegar .Marland Kitchen. , then add 10 tsp (40mL) of white vinegar ...1 gallon, then add about 6 oz of white vinegar  Instill 60mL of the vinegar solution through your catheter into the bladder up to three times daily. Leave the solution in the bladder for 10 minutes each time, then drain. Discontinue irrigation if it causes pain or discomfort. You may store any leftover solution in the refrigerator for up to 1 week.

## 2021-05-24 NOTE — Progress Notes (Signed)
05/24/2021 10:40 AM   James Mata 10/10/1930 [id number]  CC: Chief Complaint  Patient presents with   Hospitalization Follow-up   HPI: James Mata is a 87 y.o. 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   He presented to the emergency department yesterday with reports of a nondraining Foley catheter with lower abdominal pain.  ED staff had difficulty replacing his catheter but was ultimately able to place a 14 [place] coud.  UA was notable for >50 WBCs/hpf, >50 RBCs/hpf, many bacteria, and WBC clumps.  He was started on empiric Cipro and urine culture is pending.  Today he reports feeling much better now that his catheter has been replaced.  He thinks he feels better overall on antibiotics.  PMH: Past Medical History:  Diagnosis Date   CHF (congestive heart failure) (HCC)    Complication of anesthesia    had trouble waking up in 2014   Enlarged prostate    Hyperlipidemia    Hypertension     Surgical History: Past Surgical History:  Procedure Laterality Date   CORONARY ANGIOPLASTY WITH STENT PLACEMENT     CYSTOSCOPY N/A 08/11/2020   Procedure: CYSTOSCOPY;  Surgeon: Riki Altes, MD;  Location: [facility] ORS;  Service: Urology;  Laterality: N/A;   CYSTOSCOPY WITH DIRECT VISION INTERNAL URETHROTOMY N/A 08/11/2020   Procedure: CYSTOSCOPY WITH DIRECT VISION INTERNAL URETHROTOMY;  Surgeon: Riki Altes, MD;  Location: [facility] OR[facility];  Service: Urology;  Laterality: N/A;   CYSTOSCOPY WITH URETHRAL DILATATION N/A 08/11/2020   Procedure: CYSTOSCOPY WITH URETHRAL DILATATION;  Surgeon: Riki Altes, MD;  Location: [facility] ORS;  Service: Urology;  Laterality: N/A;   HERNIA REPAIR     PROSTATE ABLATION      Home  Medications:  Allergies as of 05/24/2021   No Known Allergies      Medication List        Accurate as of May 24, 2021 10:40 AM. If you have any questions, ask your nurse or doctor.          aspirin 81 MG EC tablet Take 81 mg by mouth daily.   ciprofloxacin 250 MG tablet Commonly known as: Cipro Take 1 tablet (250 mg total) by mouth 2 (two) times daily for 5 days.   cyclobenzaprine 5 MG tablet Commonly known as: FLEXERIL Take 5 mg by mouth at bedtime.   doxazosin 4 MG tablet Commonly known as: CARDURA Take 4 mg by mouth at bedtime.   finasteride 5 MG tablet Commonly known as: PROSCAR Take 1 tablet (5 mg total) by mouth daily.   fluticasone 50 MCG/ACT nasal spray Commonly known as: FLONASE Place 1 spray into both nostrils daily as needed for allergies or rhinitis.   furosemide 80 MG tablet Commonly known as: LASIX Take 80 mg by mouth 2 (two) times daily.   isosorbide mononitrate 60 MG 24 hr tablet Commonly known as: IMDUR Take 30 mg by mouth daily.   metoprolol succinate 25 MG 24 hr tablet Commonly known as: TOPROL-XL Take 12.5 mg by mouth daily.   molnupiravir EUA 200 MG Caps capsule Commonly known as: LAGEVRIO TAKE 4 CAPSULES BY MOUTH EVERY 12 HOURS FOR 5 DAYS. TAKE WITH OR WITHOUT FOOD.   mometasone 0.1 % cream Commonly known as: ELOCON Apply topically.   multivitamin with minerals  tablet Take 1 tablet by mouth daily.   nitroGLYCERIN 0.4 MG SL tablet Commonly known as: NITROSTAT Place 0.4 mg under the tongue every 5 (five) minutes as needed for chest pain.   traMADol 50 MG tablet Commonly known as: ULTRAM Take 50 mg by mouth 2 (two) times daily as needed for severe pain.   trospium 20 MG tablet Commonly known as: SANCTURA Take 1 tablet (20 mg total) by mouth daily.        Allergies:  No Known Allergies  Family History: No family history on file.  Social History:   reports that he has never smoked. He has never used smokeless  tobacco. He reports that he does not currently use alcohol. He reports that he does not currently use drugs.  Physical Exam: BP 101/61 (BP Location: Left Arm, Patient Position: Sitting, Cuff Size: Normal)   Pulse 76   Constitutional:  Alert and oriented, no acute distress, nontoxic appearing HEENT: [place], AT Cardiovascular: No clubbing, cyanosis, or edema Respiratory: Normal respiratory effort, no increased work of breathing GI: Foley catheter in place draining clear, yellow urine Skin: No rashes, bruises or suspicious lesions Neurologic: Grossly intact, no focal deficits, moving all 4 extremities Psychiatric: Normal mood and affect  Cath Change/ Replacement  Patient is present today for a catheter change due to urinary retention.  70ml of water was removed from the balloon, a 14FR coude foley cath was removed without difficulty.  Patient was cleaned and prepped in a sterile fashion with betadine and 2% lidocaine jelly was instilled into the urethra. A 18 FR coude foley cath was replaced into the bladder no complications were noted Urine return was not noted and patient was counseled to RTC if no drainage within 2 hours.. The balloon was filled with 40ml of sterile water. A night bag was attached for drainage.  Patient tolerated well.    Performed by: Carman Ching, PA-C   Assessment & Plan:   1. Retention of urine due to occlusion of Foley catheter (HCC) Foley catheter draining well today after being exchanged in the emergency department yesterday, however given his recent issues with urinary encrustation, I offered him Foley catheter replacement and upsizing today, which he accepted.  See procedure note above for further details.  We again discussed home vinegar bladder instillations and I recommended that he start these at least once daily.  Irrigation protocol included in his AVS today.  Okay to continue prescribed Cipro, however unclear if he is truly infected versus chronically  colonized with indwelling Foley.  We discussed that he may have decreased issues with encrustation after antibiotics.  Return in about 4 weeks (around 06/21/2021) for Catheter exchange.  Carman Ching, PA-C  [facility] [address] [place], [place] [address] [phone]

## 2021-05-30 ENCOUNTER — Other Ambulatory Visit: Payer: Self-pay

## 2021-05-30 ENCOUNTER — Emergency Department
Admission: EM | Admit: 2021-05-30 | Discharge: 2021-05-30 | Disposition: A | Discharge status: Home / Self Care | Payer: Medicare PPO | Attending: Emergency Medicine | Admitting: Emergency Medicine

## 2021-05-30 DIAGNOSIS — I110 Hypertensive heart disease with heart failure: Secondary | ICD-10-CM | POA: Diagnosis not present

## 2021-05-30 DIAGNOSIS — Z7982 Long term (current) use of aspirin: Secondary | ICD-10-CM | POA: Diagnosis not present

## 2021-05-30 DIAGNOSIS — E876 Hypokalemia: Secondary | ICD-10-CM | POA: Insufficient documentation

## 2021-05-30 DIAGNOSIS — R339 Retention of urine, unspecified: Secondary | ICD-10-CM | POA: Diagnosis present

## 2021-05-30 DIAGNOSIS — I509 Heart failure, unspecified: Secondary | ICD-10-CM | POA: Diagnosis not present

## 2021-05-30 DIAGNOSIS — Z79899 Other long term (current) drug therapy: Secondary | ICD-10-CM | POA: Diagnosis not present

## 2021-05-30 DIAGNOSIS — R8271 Bacteriuria: Secondary | ICD-10-CM | POA: Insufficient documentation

## 2021-05-30 LAB — CBC
HCT: 37.3 % — ABNORMAL LOW (ref 39.0–52.0)
Hemoglobin: 12.8 g/dL — ABNORMAL LOW (ref 13.0–17.0)
MCH: 32.7 pg (ref 26.0–34.0)
MCHC: 34.3 g/dL (ref 30.0–36.0)
MCV: 95.4 fL (ref 80.0–100.0)
Platelets: 174 10*3/uL (ref 150–400)
RBC: 3.91 MIL/uL — ABNORMAL LOW (ref 4.22–5.81)
RDW: 14.2 % (ref 11.5–15.5)
WBC: 10.1 10*3/uL (ref 4.0–10.5)
nRBC: 0.2 % (ref 0.0–0.2)

## 2021-05-30 LAB — URINALYSIS, ROUTINE W REFLEX MICROSCOPIC: Specific Gravity, Urine: 1.01 (ref 1.005–1.030)

## 2021-05-30 LAB — BASIC METABOLIC PANEL
Anion gap: 9 (ref 5–15)
BUN: 19 mg/dL (ref 8–23)
CO2: 26 mmol/L (ref 22–32)
Calcium: 9 mg/dL (ref 8.9–10.3)
Chloride: 100 mmol/L (ref 98–111)
Creatinine, Ser: 1.27 mg/dL — ABNORMAL HIGH (ref 0.61–1.24)
GFR, Estimated: 54 mL/min — ABNORMAL LOW (ref >60)
Glucose, Bld: 126 mg/dL — ABNORMAL HIGH (ref 70–99)
Potassium: 3.3 mmol/L — ABNORMAL LOW (ref 3.5–5.1)
Sodium: 135 mmol/L (ref 135–145)

## 2021-05-30 MED ORDER — POTASSIUM CHLORIDE CRYS ER 20 MEQ PO TBCR
40.0000 meq | EXTENDED_RELEASE_TABLET | Freq: Once | ORAL | Status: AC
  Administered 2021-05-30: 40 meq via ORAL
  Filled 2021-05-30: qty 2

## 2021-05-30 MED ORDER — FOSFOMYCIN TROMETHAMINE 3 G PO PACK
3.0000 g | PACK | Freq: Once | ORAL | Status: AC
  Administered 2021-05-30: 3 g via ORAL
  Filled 2021-05-30: qty 3

## 2021-05-30 NOTE — ED Provider Notes (Signed)
[facility] Emergency Department Provider Note  ____________________________________________   None    (approximate)  I have reviewed the triage vital signs and the nursing notes.   HISTORY  Chief Complaint Urinary Retention   HPI James Mata is a 87 y.o. male with Paschal history of CHF, HTN, HDL, and prostate enlargement with chronic indwelling Foley catheter complicated by diffuse resolving obstruction most recently seen in the ED for this on 10/30 who presents for assessment of 5 or 6 hours of some suprapubic abdominal discomfort and decreased Foley output.  Patient states he has had issues with blood clots getting stuck in there and sometimes have some red urine.  He states that prior to the last couple hours before presentation emergency room he had not had any significant difficulty and seemed to have good urine output through his Foley bag it was yellow immediately earlier today.  Otherwise she is denying any back pain or diarrhea.  He states he is a chronic issue with constipation for which he takes a laxative.  States has been a couple days since last bowel movement.  He denies any headache, earache, sore throat, nausea, vomiting, chest pain, cough, rash or extremity pain or any other acute concerns.           Past Medical History:  Diagnosis Date   CHF (congestive heart failure) ([place])    Complication of anesthesia    had trouble waking up in 2014   Enlarged prostate    Hyperlipidemia    Hypertension     Patient Active Problem List   Diagnosis Date Noted   Increased frequency of urination 07/12/2013   Benign localized hyperplasia of prostate with urinary obstruction and other lower urinary tract symptoms (LUTS)(600.21) 05/02/2013    Past Surgical History:  Procedure Laterality Date   CORONARY ANGIOPLASTY WITH STENT PLACEMENT     CYSTOSCOPY N/A 08/11/2020   Procedure: CYSTOSCOPY;  Surgeon: Abbie Sons, MD;  Location: [facility] ORS;   Service: Urology;  Laterality: N/A;   CYSTOSCOPY WITH DIRECT VISION INTERNAL URETHROTOMY N/A 08/11/2020   Procedure: CYSTOSCOPY WITH DIRECT VISION INTERNAL URETHROTOMY;  Surgeon: Abbie Sons, MD;  Location: [facility] ORS;  Service: Urology;  Laterality: N/A;   CYSTOSCOPY WITH URETHRAL DILATATION N/A 08/11/2020   Procedure: CYSTOSCOPY WITH URETHRAL DILATATION;  Surgeon: Abbie Sons, MD;  Location: [facility] ORS;  Service: Urology;  Laterality: N/A;   HERNIA REPAIR     PROSTATE ABLATION      Prior to Admission medications   Medication Sig Start Date End Date Taking? Authorizing Provider  aspirin 81 MG EC tablet Take 81 mg by mouth daily.    [provider]  cyclobenzaprine (FLEXERIL) 5 MG tablet Take 5 mg by mouth at bedtime. 02/04/20   [provider]  doxazosin (CARDURA) 4 MG tablet Take 4 mg by mouth at bedtime. 12/30/19   [provider]  finasteride (PROSCAR) 5 MG tablet Take 1 tablet (5 mg total) by mouth daily. 09/16/20   Vaillancourt, Aldona Bar, PA-C  fluticasone (FLONASE) 50 MCG/ACT nasal spray Place 1 spray into both nostrils daily as needed for allergies or rhinitis.    [provider]  furosemide (LASIX) 80 MG tablet Take 80 mg by mouth 2 (two) times daily. 12/21/19   [provider]  isosorbide mononitrate (IMDUR) 60 MG 24 hr tablet Take 30 mg by mouth daily. 12/21/19   [provider]  metoprolol succinate (TOPROL-XL) 25 MG 24 hr tablet Take 12.5 mg  by mouth daily. 12/21/19   [provider]  Molnupiravir 200 MG CAPS TAKE 4 CAPSULES BY MOUTH EVERY 12 HOURS FOR 5 DAYS. TAKE WITH OR WITHOUT FOOD. 09/09/20 09/09/21  Morayati, Lourdes Sledge, MD  mometasone (ELOCON) 0.1 % cream Apply topically. 04/22/21   [provider]  Multiple Vitamins-Minerals (MULTIVITAMIN WITH MINERALS) tablet Take 1 tablet by mouth daily.    [provider]  nitroGLYCERIN (NITROSTAT) 0.4 MG SL tablet Place 0.4 mg under the tongue every 5 (five)  minutes as needed for chest pain. 02/04/20   [provider]  traMADol (ULTRAM) 50 MG tablet Take 50 mg by mouth 2 (two) times daily as needed for severe pain. 01/01/20   [provider]  trospium (SANCTURA) 20 MG tablet Take 1 tablet (20 mg total) by mouth daily. 07/13/20   Debroah Loop, PA-C    Allergies Patient has no known allergies.  No family history on file.  Social History Social History   Tobacco Use   Smoking status: Never   Smokeless tobacco: Never  Vaping Use   Vaping Use: Never used  Substance Use Topics   Alcohol use: Not Currently   Drug use: Not Currently    Review of Systems  Review of Systems  Constitutional:  Negative for chills and fever.  HENT:  Negative for sore throat.   Eyes:  Negative for pain.  Respiratory:  Negative for cough and stridor.   Cardiovascular:  Negative for chest pain.  Gastrointestinal:  Positive for abdominal pain and constipation. Negative for vomiting.  Genitourinary:  Positive for hematuria.  Skin:  Negative for rash.  Neurological:  Negative for seizures, loss of consciousness and headaches.  Psychiatric/Behavioral:  Negative for suicidal ideas.   All other systems reviewed and are negative.    ____________________________________________   PHYSICAL EXAM:  VITAL SIGNS: ED Triage Vitals  Enc Vitals Group     BP 05/30/21 0455 135/89     Pulse Rate 05/30/21 0455 (!) 109     Resp 05/30/21 0455 17     Temp 05/30/21 0455 98.4 F (36.9 C)     Temp Source 05/30/21 0455 Oral     SpO2 05/30/21 0455 91 %     Weight 05/30/21 0434 154 lb 15.7 oz (70.3 kg)     Height --      Head Circumference --      Peak Flow --      Pain Score 05/30/21 0434 7     Pain Loc --      Pain Edu? --      Excl. in [place]? --    Vitals:   05/30/21 0455  BP: 135/89  Pulse: (!) 109  Resp: 17  Temp: 98.4 F (36.9 C)  SpO2: 91%   Physical Exam Vitals and nursing note reviewed.  Constitutional:      Appearance: He is  well-developed.  HENT:     Head: Normocephalic and atraumatic.     Right Ear: External ear normal.     Left Ear: External ear normal.  Eyes:     Conjunctiva/sclera: Conjunctivae normal.  Cardiovascular:     Rate and Rhythm: Normal rate and regular rhythm.     Heart sounds: No murmur heard. Pulmonary:     Effort: Pulmonary effort is normal. No respiratory distress.     Breath sounds: Normal breath sounds.  Abdominal:     Palpations: Abdomen is soft.     Tenderness: There is no abdominal tenderness. There is no right  CVA tenderness or left CVA tenderness.  Musculoskeletal:     Cervical back: Neck supple.     Right lower leg: Edema present.     Left lower leg: Edema present.  Skin:    General: Skin is warm and dry.  Neurological:     General: No focal deficit present.     Mental Status: He is alert.  Psychiatric:        Mood and Affect: Mood normal.    Very mild suprapubic discomfort. ____________________________________________   LABS (all labs ordered are listed, but only abnormal results are displayed)  Labs Reviewed  URINALYSIS, ROUTINE W REFLEX MICROSCOPIC - Abnormal; Notable for the following components:      Result Value   Color, Urine RED (*)    APPearance CLOUDY (*)    Glucose, UA   (*)    Value: TEST NOT REPORTED DUE TO COLOR INTERFERENCE OF URINE PIGMENT   Hgb urine dipstick   (*)    Value: TEST NOT REPORTED DUE TO COLOR INTERFERENCE OF URINE PIGMENT   Bilirubin Urine   (*)    Value: TEST NOT REPORTED DUE TO COLOR INTERFERENCE OF URINE PIGMENT   Ketones, ur   (*)    Value: TEST NOT REPORTED DUE TO COLOR INTERFERENCE OF URINE PIGMENT   Protein, ur   (*)    Value: TEST NOT REPORTED DUE TO COLOR INTERFERENCE OF URINE PIGMENT   Nitrite   (*)    Value: TEST NOT REPORTED DUE TO COLOR INTERFERENCE OF URINE PIGMENT   Leukocytes,Ua   (*)    Value: TEST NOT REPORTED DUE TO COLOR INTERFERENCE OF URINE PIGMENT   Bacteria, UA RARE (*)    All other components within  normal limits  BASIC METABOLIC PANEL - Abnormal; Notable for the following components:   Potassium 3.3 (*)    Glucose, Bld 126 (*)    Creatinine, Ser 1.27 (*)    GFR, Estimated 54 (*)    All other components within normal limits  CBC - Abnormal; Notable for the following components:   RBC 3.91 (*)    Hemoglobin 12.8 (*)    HCT 37.3 (*)    All other components within normal limits  URINE CULTURE   ____________________________________________  EKG  ____________________________________________  RADIOLOGY  ED MD interpretation:   Official radiology report(s): No results found.  ____________________________________________   PROCEDURES  Procedure(s) performed (including Critical Care):  Procedures   ____________________________________________   INITIAL IMPRESSION / ASSESSMENT AND PLAN / ED COURSE      Patient presents with above-stated history exam with concerns for possible acute urinary retention with chronic indwelling Foley that seems to have been working well until couple hours prior to arrival when he noticed decreased output and some development of some suprapubic abdominal discomfort.  On arrival patient is slight tachycardic at 109 and SPO2 of 91% with otherwise stable vital signs on room air.  Exam his lungs are clear bilaterally he is in no significant respiratory distress.  He is oriented and accompanied by family.  Prior to my assessment Foley was exchanged and immediately prior to exchange patient had apparently significant mount of leakage around the Foley and following placement of new Foley had 250 cc of blood-tinged urine output.  On my assessment patient states he is feeling a little better although has some very mild residual tightness in the lower abdomen. Basic labs were sent including a CBC that showed no leukocytosis and hemoglobin of 12.8 compared to 14.55 months  ago.  BMP remarkable for K of 3.3 and kidney function of 1.27 compared to 1.389 months  ago.  UA is 21-50 WBCs and rare bacteria but otherwise interference from documentation precludes further analysis.  Urine culture sent.   Fortunately patient had very little urine output with 14 [place] coud.  Discussed with urology who recommended irrigation and if this did not improve output placing 18 [place].  No improvement with irrigation and thus 18 [place] placed.  Following placement of 102 French Foley catheter patient had significant urine output slightly over 1 L.  He states he is feeling tremendously better.  On my assessment his heart rate is in the 80s on manual palpation.  Will give a one-time dose of fosfomycin for his bacteriuria is at this time would not believe he is septic as he has no fever, CVA tenderness, hypotension or leukocytosis.  He will follow-up with urology.  Discharged in stable condition.       ____________________________________________   FINAL CLINICAL IMPRESSION(S) / ED DIAGNOSES  Final diagnoses:  Urinary retention  Hypokalemia  Bacteriuria    Medications  potassium chloride SA (KLOR-CON) CR tablet 40 mEq (has no administration in time range)  fosfomycin (MONUROL) packet 3 g (has no administration in time range)     ED Discharge Orders     None        Note:  This document was prepared using Dragon voice recognition software and may include unintentional dictation errors.    Lucrezia Starch, MD 05/30/21 1350

## 2021-05-30 NOTE — ED Notes (Signed)
Bladder scan 

## 2021-05-30 NOTE — ED Triage Notes (Signed)
Patient's son-in-law reports that patient had catheter replaced on Monday and Dr. Weston Settle office. Patient c/o urinary retention for approximately 5-6 hours. Patient c/o lower abdominal pressure.

## 2021-05-30 NOTE — ED Notes (Signed)
Attempts to irrigate foley unsuccessful. Md Katrinka Blazing, Z notified via secure messaging

## 2021-05-30 NOTE — ED Notes (Signed)
Small clot drained once new foley coude placed and urine began draining.

## 2021-05-30 NOTE — ED Notes (Addendum)
Patients catheter locked and placed in lap, unable to drain. Patient and family educated on keeping catheter below bladder. Patients pants and underwear soaked in urine from draining around catheter tube. Attempt at irrigation- foley would take in fluid but not drain back out.

## 2021-05-31 ENCOUNTER — Other Ambulatory Visit: Payer: Self-pay | Admitting: Physician Assistant

## 2021-05-31 ENCOUNTER — Telehealth: Payer: Self-pay | Admitting: Physician Assistant

## 2021-05-31 ENCOUNTER — Ambulatory Visit: Payer: Medicare PPO | Admitting: Physician Assistant

## 2021-05-31 ENCOUNTER — Ambulatory Visit
Admission: RE | Admit: 2021-05-31 | Discharge: 2021-05-31 | Disposition: A | Discharge status: Home / Self Care | Payer: Medicare PPO | Source: Ambulatory Visit | Attending: Physician Assistant | Admitting: Physician Assistant

## 2021-05-31 DIAGNOSIS — M47816 Spondylosis without myelopathy or radiculopathy, lumbar region: Secondary | ICD-10-CM | POA: Insufficient documentation

## 2021-05-31 DIAGNOSIS — T83198A Other mechanical complication of other urinary devices and implants, initial encounter: Secondary | ICD-10-CM

## 2021-05-31 DIAGNOSIS — R338 Other retention of urine: Secondary | ICD-10-CM | POA: Insufficient documentation

## 2021-05-31 LAB — URINE CULTURE: Culture: NO GROWTH

## 2021-05-31 NOTE — Progress Notes (Signed)
05/31/2021 12:15 PM   James Mata [birth year] [id number]  CC: Chief Complaint  Patient presents with   Urinary Retention   HPI: James Mata is a 87 y.o. male with PMH BPH with urinary retention s/p PVP in 2014 now managed with chronic indwelling Foley catheter and due for a second voiding trial on finasteride in January 2023, elevated PSA who discontinued screening, and postprocedural bladder neck contracture requiring OR dilation and now with cystoscopy findings of tortuous prostatic urethra who was recently struggled with urinary encrustation/catheter occlusion who presents today for ED follow-up.   He was seen in the emergency department yesterday for evaluation of a nondraining Foley catheter associated with clot passage.  A 14 French coud was placed in the ED with minimal urinary output, which was subsequently upsized to an 18 [place] Foley catheter with 1 L of urinary output.  He was also treated with 1 dose of fosfomycin for management of bacteriuria, urine culture pending.  Today, patient reports that his catheter is extending past his urethral meatus more than usual.  He is draining amber urine today with some clot material in the tubing.  He states that they attempted manual irrigation at home but were having difficulty with this.  Patient would like to defer consideration of SP tube or other intervention pending his planned voiding trial on finasteride in January 2023.  He underwent KUB today, which revealed a radiopacity within the left hemipelvis possibly representing a left ureteral stone.  Patient denies flank pain or significant gross hematuria.  No evidence of bladder stone.  PMH: Past Medical History:  Diagnosis Date   CHF (congestive heart failure) ([place])    Complication of anesthesia    had trouble waking up in 2014   Enlarged prostate    Hyperlipidemia    Hypertension     Surgical History: Past Surgical History:  Procedure Laterality Date   CORONARY  ANGIOPLASTY WITH STENT PLACEMENT     CYSTOSCOPY N/A 08/11/2020   Procedure: CYSTOSCOPY;  Surgeon: Abbie Sons, MD;  Location: [facility] ORS;  Service: Urology;  Laterality: N/A;   CYSTOSCOPY WITH DIRECT VISION INTERNAL URETHROTOMY N/A 08/11/2020   Procedure: CYSTOSCOPY WITH DIRECT VISION INTERNAL URETHROTOMY;  Surgeon: Abbie Sons, MD;  Location: [facility] ORS;  Service: Urology;  Laterality: N/A;   CYSTOSCOPY WITH URETHRAL DILATATION N/A 08/11/2020   Procedure: CYSTOSCOPY WITH URETHRAL DILATATION;  Surgeon: Abbie Sons, MD;  Location: [facility] ORS;  Service: Urology;  Laterality: N/A;   HERNIA REPAIR     PROSTATE ABLATION      Home Medications:  Allergies as of 05/31/2021   No Known Allergies      Medication List        Accurate as of May 31, 2021 12:15 PM. If you have any questions, ask your nurse or doctor.          aspirin 81 MG EC tablet Take 81 mg by mouth daily.   cyclobenzaprine 5 MG tablet Commonly known as: FLEXERIL Take 5 mg by mouth at bedtime.   doxazosin 4 MG tablet Commonly known as: CARDURA Take 4 mg by mouth at bedtime.   finasteride 5 MG tablet Commonly known as: PROSCAR Take 1 tablet (5 mg total) by mouth daily.   fluticasone 50 MCG/ACT nasal spray Commonly known as: FLONASE Place 1 spray into both nostrils daily as needed for allergies or rhinitis.   furosemide 80 MG tablet Commonly known as: LASIX Take 80 mg by mouth 2 (two) times  daily.   isosorbide mononitrate 60 MG 24 hr tablet Commonly known as: IMDUR Take 30 mg by mouth daily.   metoprolol succinate 25 MG 24 hr tablet Commonly known as: TOPROL-XL Take 12.5 mg by mouth daily.   molnupiravir EUA 200 MG Caps capsule Commonly known as: LAGEVRIO TAKE 4 CAPSULES BY MOUTH EVERY 12 HOURS FOR 5 DAYS. TAKE WITH OR WITHOUT FOOD.   mometasone 0.1 % cream Commonly known as: ELOCON Apply topically.   multivitamin with minerals tablet Take 1 tablet by mouth daily.   nitroGLYCERIN 0.4 MG  SL tablet Commonly known as: NITROSTAT Place 0.4 mg under the tongue every 5 (five) minutes as needed for chest pain.   traMADol 50 MG tablet Commonly known as: ULTRAM Take 50 mg by mouth 2 (two) times daily as needed for severe pain.   trospium 20 MG tablet Commonly known as: SANCTURA Take 1 tablet (20 mg total) by mouth daily.       Allergies:  No Known Allergies  Family History: No family history on file.  Social History:   reports that he has never smoked. He has never used smokeless tobacco. He reports that he does not currently use alcohol. He reports that he does not currently use drugs.  Physical Exam: There were no vitals taken for this visit.  Constitutional:  Alert and oriented, no acute distress, nontoxic appearing HEENT: [place], AT Cardiovascular: No clubbing, cyanosis, or edema Respiratory: Normal respiratory effort, no increased work of breathing Skin: No rashes, bruises or suspicious lesions GU: Foley catheter seen extending past the urethral meatus approximately 10 cm, approximately twice as long as is typical for him.  Scant crusted blood at the urethral meatus. Neurologic: Grossly intact, no focal deficits, moving all 4 extremities Psychiatric: Normal mood and affect  Pertinent Imaging: KUB, 05/31/2021: CLINICAL DATA:  Urinary retention   EXAM: ABDOMEN - 1 VIEW   COMPARISON:  None.   FINDINGS: Bowel gas pattern is nonspecific. No abnormal masses or calcifications are seen. Kidneys are partly obscured by bowel contents. Degenerative changes are noted in the lumbar spine with disc space narrowing, bony spurs and facet hypertrophy.   IMPRESSION: Bowel gas pattern is nonspecific. No abnormal masses or calcifications are seen.   Lumbar spondylosis.     Electronically Signed   By: Elmer Picker M.D.   On: 06/01/2021 14:05  I personally reviewed the imaging above and note a possible distal left ureteral stone with no evidence of bladder  stone.  Bladder Irrigation  Due to clot passage patient is present today for a bladder irrigation. Patient was cleaned and prepped in a sterile fashion. 240 ml of sterile water was instilled and irrigated into the bladder with a 73ml [place] syringe through the catheter in place.  Efflux cleared from light pink to clear and approximately 5ccs of clot material were evacuated from the bladder. Catheter is now draining without difficulty.  Catheter was reattached to the night bag for drainage. Patient tolerated well.   Performed by: Debroah Loop, PA-C   Assessment & Plan:   1. Retention of urine due to occlusion of Foley catheter ([place]) Nondraining Foley catheter yesterday possibly due to clot occlusion.  Today, his bladder was noted to be incompletely emptied due to misplaced Foley catheter, likely inflated into the prostatic urethra.   I attempted to reposition the Foley catheter in clinic today, draining 4 cc of water from the balloon and then advancing the catheter.  Once I felt the catheter tip slip into  the bladder, I immediately noted a rapid urinary efflux and reinflated balloon with 10 cc of sterile water.  Report back on the catheter, and it was noted to protrude past the urethral meatus pretty typical 4 to 5 cm consistent with appropriate Foley placement.  I subsequently irrigated his catheter as above, evacuating a small clot in his catheter was noted to irrigate easily at this point.  Counseled the patient to continue at home bladder irrigation as needed and will proceed with monthly catheter change as scheduled.  As above, patient would like to defer consideration of SP tube versus other intervention pending his scheduled voiding trial on finasteride in January 2023. - US RENAL; Future  Return if symptoms worsen or fail to improve.  Carman Ching, PA-C  [facility] [address], [address] [place], [place] [address] [phone]

## 2021-05-31 NOTE — Telephone Encounter (Signed)
Pt's daughter said pt had to go to ER last weekend.  He is having the same problems since this past Saturday night, His catheter wasn't draining at all.  He ended up in the ER again.  There were blood clots in urine.  It has currently stopped draining again.  Pt is having pain this morning and pt just doesn't feel right.

## 2021-05-31 NOTE — Telephone Encounter (Signed)
Per Sam, patient should go get a KUB and then come to the office to see her at 11:30am.    Patient's daughter voiced understanding.

## 2021-06-04 ENCOUNTER — Ambulatory Visit: Payer: Medicare PPO | Admitting: Physician Assistant

## 2021-06-08 ENCOUNTER — Ambulatory Visit: Payer: Medicare PPO | Admitting: Physician Assistant

## 2021-06-23 ENCOUNTER — Other Ambulatory Visit: Payer: Self-pay

## 2021-06-23 ENCOUNTER — Ambulatory Visit: Payer: Medicare PPO | Admitting: Physician Assistant

## 2021-06-23 DIAGNOSIS — R339 Retention of urine, unspecified: Secondary | ICD-10-CM

## 2021-06-23 NOTE — Progress Notes (Signed)
Cath Change/ Replacement  Patient is present today for a catheter change due to urinary retention.  35ml of water was removed from the balloon, a 18FR coude foley cath was removed without difficulty.  Patient was cleaned and prepped in a sterile fashion with betadine and 2% lidocaine jelly was instilled into the urethra. A 18 FR coude foley cath was replaced into the bladder no complications were noted Urine return was noted 38ml and urine was pink in color. The balloon was filled with 62ml of sterile water. A night bag was attached for drainage.  Patient tolerated well.    Performed by: Carman Ching, PA-C   Follow up: Return in about 4 weeks (around 07/21/2021) for Catheter exchange.

## 2021-06-26 ENCOUNTER — Other Ambulatory Visit: Payer: Self-pay | Admitting: Physician Assistant

## 2021-06-26 DIAGNOSIS — R338 Other retention of urine: Secondary | ICD-10-CM

## 2021-06-26 DIAGNOSIS — N401 Enlarged prostate with lower urinary tract symptoms: Secondary | ICD-10-CM

## 2021-07-02 ENCOUNTER — Ambulatory Visit: Payer: Medicare PPO | Admitting: Physician Assistant

## 2021-07-02 ENCOUNTER — Other Ambulatory Visit: Payer: Self-pay

## 2021-07-02 DIAGNOSIS — T839XXA Unspecified complication of genitourinary prosthetic device, implant and graft, initial encounter: Secondary | ICD-10-CM | POA: Diagnosis not present

## 2021-07-02 DIAGNOSIS — R339 Retention of urine, unspecified: Secondary | ICD-10-CM

## 2021-07-02 LAB — BLADDER SCAN AMB NON-IMAGING: Scan Result: 81 ml

## 2021-07-02 NOTE — Progress Notes (Signed)
07/02/2021 10:26 AM   James Mata 03/17/1931 [id number]  CC: Chief Complaint  Patient presents with   Urinary Retention   HPI: James Mata is a 87 y.o. 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   Today he reports he rolled over on his catheter during the night 2-3 nights ago and ever since he has noticed that his catheter is protruding from his urethral meatus more than usual.  His catheter continues to drain urine and he denies any significant gross hematuria with this, however he has had more bladder spasms. Bladder scan with 22mL.  PMH: Past Medical History:  Diagnosis Date   CHF (congestive heart failure) (HCC)    Complication of anesthesia    had trouble waking up in 2014   Enlarged prostate    Hyperlipidemia    Hypertension     Surgical History: Past Surgical History:  Procedure Laterality Date   CORONARY ANGIOPLASTY WITH STENT PLACEMENT     CYSTOSCOPY N/A 08/11/2020   Procedure: CYSTOSCOPY;  Surgeon: Riki Altes, MD;  Location: [facility];  Service: Urology;  Laterality: N/A;   CYSTOSCOPY WITH DIRECT VISION INTERNAL URETHROTOMY N/A 08/11/2020   Procedure: CYSTOSCOPY WITH DIRECT VISION INTERNAL URETHROTOMY;  Surgeon: Riki Altes, MD;  Location: [facility];  Service: Urology;  Laterality: N/A;   CYSTOSCOPY WITH URETHRAL DILATATION N/A 08/11/2020   Procedure: CYSTOSCOPY WITH URETHRAL DILATATION;  Surgeon: Riki Altes, MD;  Location: [facility];  Service: Urology;  Laterality: N/A;    HERNIA REPAIR     PROSTATE ABLATION      Home Medications:  Allergies as of 07/02/2021   No Known Allergies      Medication List        Accurate as of July 02, 2021 10:26 AM. If you have any questions, ask your nurse or doctor.          aspirin 81 MG EC tablet Take 81 mg by mouth daily.   cyclobenzaprine 5 MG tablet Commonly known as: FLEXERIL Take 5 mg by mouth at bedtime.   doxazosin 4 MG tablet Commonly known as: CARDURA Take 4 mg by mouth at bedtime.   finasteride 5 MG tablet Commonly known as: PROSCAR TAKE 1 TABLET (5 MG TOTAL) BY MOUTH DAILY.   fluticasone 50 MCG/ACT nasal spray Commonly known as: FLONASE Place 1 spray into both nostrils daily as needed for allergies or rhinitis.   furosemide 80 MG tablet Commonly known as: LASIX Take 80 mg by mouth 2 (two) times daily.   isosorbide mononitrate 60 MG 24 hr tablet Commonly known as: IMDUR Take 30 mg by mouth daily.   metoprolol succinate 25 MG 24 hr tablet Commonly known as: TOPROL-XL Take 12.5 mg by mouth daily.   molnupiravir EUA 200 MG Caps capsule Commonly known as: LAGEVRIO TAKE 4 CAPSULES BY MOUTH EVERY 12 HOURS FOR 5 DAYS. TAKE WITH OR WITHOUT FOOD.   mometasone 0.1 % cream Commonly known as: ELOCON Apply topically.   multivitamin with minerals tablet Take 1 tablet by mouth daily.   nitroGLYCERIN 0.4 MG SL tablet Commonly known  as: NITROSTAT Place 0.4 mg under the tongue every 5 (five) minutes as needed for chest pain.   traMADol 50 MG tablet Commonly known as: ULTRAM Take 50 mg by mouth 2 (two) times daily as needed for severe pain.   trospium 20 MG tablet Commonly known as: SANCTURA Take 1 tablet (20 mg total) by mouth daily.        Allergies:  No Known Allergies  Family History: No family history on file.  Social History:   reports that he has never smoked. He has never used smokeless tobacco. He reports that he does not currently use alcohol. He reports that he  does not currently use drugs.  Physical Exam: There were no vitals taken for this visit.  Constitutional:  Alert and oriented, no acute distress, nontoxic appearing HEENT: [place], AT Cardiovascular: No clubbing, cyanosis, or edema Respiratory: Normal respiratory effort, no increased work of breathing GU: Foley catheter in place draining yellow urine, however catheter does appear to be protruding from the meatus longer than is typical for patient. Skin: No rashes, bruises or suspicious lesions Neurologic: Grossly intact, no focal deficits, moving all 4 extremities Psychiatric: Normal mood and affect  Laboratory Data: Results for orders placed or performed in visit on 07/02/21  BLADDER SCAN AMB NON-IMAGING  Result Value Ref Range   Scan Result 81 ml   Cath Change/ Replacement  Patient is present today for a catheter change due to urinary retention.  62ml of water was removed from the balloon, a 18FR coude foley cath was removed without difficulty.  Patient was cleaned and prepped in a sterile fashion with betadine and 2% lidocaine jelly was instilled into the urethra. A 18 FR coude foley cath was replaced into the bladder no complications were noted Urine return was noted 167ml and urine was yellow in color. The balloon was filled with 40ml of sterile water. A night bag was attached for drainage.  Patient tolerated well.    Performed by: Debroah Loop, PA-C   Assessment & Plan:   1. Foley catheter problem, initial encounter [facility]) With his history of bladder neck contracture and tortuous prostatic urethra, I suspect he did pull the Foley balloon into the prostatic urethra. I offered him Foley replacement today, which he accepted. See procedure note above. I hyperinflated the balloon to 30ccs today to try to prevent repeat Foley pull.  He is due for second voiding trial on finasteride next month, will plan for this at his next visit.  Return in about 4 weeks (around 07/30/2021) for  Voiding trial.  Debroah Loop, PA-C  [facility] [address] [place], [place] [address] [phone]

## 2021-07-08 ENCOUNTER — Telehealth: Payer: Self-pay | Admitting: Physician Assistant

## 2021-07-08 NOTE — Telephone Encounter (Signed)
Patient is requesting a refill of Trospium.  His appt is 08/02/21.  RX can be sent to [facility] in [place]

## 2021-07-09 ENCOUNTER — Other Ambulatory Visit: Payer: Self-pay | Admitting: Physician Assistant

## 2021-07-09 DIAGNOSIS — N3289 Other specified disorders of bladder: Secondary | ICD-10-CM

## 2021-07-09 MED ORDER — TROSPIUM CHLORIDE 20 MG PO TABS: 20.0000 mg | ORAL_TABLET | Freq: Every day | ORAL | 11 refills | Status: DC

## 2021-07-21 ENCOUNTER — Ambulatory Visit: Payer: Medicare PPO | Admitting: Physician Assistant

## 2021-08-02 ENCOUNTER — Ambulatory Visit: Payer: Medicare PPO

## 2021-08-02 ENCOUNTER — Other Ambulatory Visit: Payer: Self-pay

## 2021-08-02 DIAGNOSIS — R339 Retention of urine, unspecified: Secondary | ICD-10-CM

## 2021-08-02 NOTE — Progress Notes (Signed)
Cath Change/ Replacement  Patient is present today for a catheter change due to urinary retention.  65ml of water was removed from the balloon, a 18FR coude foley cath was removed with out difficulty.  Patient was cleaned and prepped in a sterile fashion with betadine and 2% lidocaine jelly was instilled into the urethra. A 18 FR coude foley cath was replaced into the bladder, no complications were noted. Urine return was noted 22ml and urine was yellow in color. The balloon was filled with 35ml of sterile water. A night bag was attached for drainage.  Patient was given proper instruction on catheter care.    Performed by: Bradly Bienenstock CMA  Follow up: RTC in 1 mo. Pt due for voiding trial today however pt expressed concern over failing voiding trial and elected to have catheter switched out instead. Advised pt he would have his next cath exchange scheduled with Larene [place] so he can go over any questions or concerns he might have about voiding trial and or SPT placement.

## 2021-08-12 ENCOUNTER — Telehealth: Payer: Self-pay | Admitting: Family Medicine

## 2021-08-12 NOTE — Telephone Encounter (Signed)
Merry Proud from [facility] left message on triage line. He was wanting a order for home catheter changes. Per last office visit note. He is supposed to come in to discuss possible SPT placement. I returned call to [facility] and had to leave a message for him to call back.

## 2021-08-13 ENCOUNTER — Telehealth: Payer: Self-pay | Admitting: Urology

## 2021-08-13 NOTE — Telephone Encounter (Signed)
Pts daughter wants to knw if she can come pick up some sterile water solution used to irrigate the bag/cath for her father. She said Sam gave her some last visit and wants to know if she can come pick some up.

## 2021-08-13 NOTE — Telephone Encounter (Signed)
Spoke with daughter. Will pick up.

## 2021-08-17 ENCOUNTER — Inpatient Hospital Stay: Payer: Medicare PPO

## 2021-08-17 ENCOUNTER — Encounter: Payer: Self-pay | Admitting: *Deleted

## 2021-08-17 ENCOUNTER — Inpatient Hospital Stay
Admission: EM | Admit: 2021-08-17 | Discharge: 2021-08-22 | DRG: 291 | Disposition: A | Discharge status: Home Health | Payer: Medicare PPO | Attending: Internal Medicine | Admitting: Internal Medicine

## 2021-08-17 ENCOUNTER — Inpatient Hospital Stay
Admit: 2021-08-17 | Discharge: 2021-08-17 | Disposition: A | Discharge status: Home / Self Care | Payer: Medicare PPO | Attending: Internal Medicine | Admitting: Internal Medicine

## 2021-08-17 ENCOUNTER — Other Ambulatory Visit: Payer: Self-pay

## 2021-08-17 ENCOUNTER — Emergency Department: Payer: Medicare PPO

## 2021-08-17 DIAGNOSIS — N138 Other obstructive and reflux uropathy: Secondary | ICD-10-CM | POA: Diagnosis present

## 2021-08-17 DIAGNOSIS — Z955 Presence of coronary angioplasty implant and graft: Secondary | ICD-10-CM

## 2021-08-17 DIAGNOSIS — T839XXA Unspecified complication of genitourinary prosthetic device, implant and graft, initial encounter: Secondary | ICD-10-CM | POA: Diagnosis not present

## 2021-08-17 DIAGNOSIS — J9611 Chronic respiratory failure with hypoxia: Secondary | ICD-10-CM

## 2021-08-17 DIAGNOSIS — Z20822 Contact with and (suspected) exposure to covid-19: Secondary | ICD-10-CM | POA: Diagnosis present

## 2021-08-17 DIAGNOSIS — R509 Fever, unspecified: Secondary | ICD-10-CM | POA: Diagnosis not present

## 2021-08-17 DIAGNOSIS — I4510 Unspecified right bundle-branch block: Secondary | ICD-10-CM | POA: Diagnosis present

## 2021-08-17 DIAGNOSIS — Z79899 Other long term (current) drug therapy: Secondary | ICD-10-CM | POA: Diagnosis not present

## 2021-08-17 DIAGNOSIS — R339 Retention of urine, unspecified: Secondary | ICD-10-CM | POA: Diagnosis not present

## 2021-08-17 DIAGNOSIS — I110 Hypertensive heart disease with heart failure: Secondary | ICD-10-CM | POA: Diagnosis present

## 2021-08-17 DIAGNOSIS — R001 Bradycardia, unspecified: Secondary | ICD-10-CM | POA: Diagnosis not present

## 2021-08-17 DIAGNOSIS — E876 Hypokalemia: Secondary | ICD-10-CM | POA: Diagnosis present

## 2021-08-17 DIAGNOSIS — N3289 Other specified disorders of bladder: Secondary | ICD-10-CM | POA: Diagnosis not present

## 2021-08-17 DIAGNOSIS — Z87891 Personal history of nicotine dependence: Secondary | ICD-10-CM

## 2021-08-17 DIAGNOSIS — E785 Hyperlipidemia, unspecified: Secondary | ICD-10-CM | POA: Diagnosis present

## 2021-08-17 DIAGNOSIS — R338 Other retention of urine: Secondary | ICD-10-CM | POA: Diagnosis present

## 2021-08-17 DIAGNOSIS — I509 Heart failure, unspecified: Secondary | ICD-10-CM

## 2021-08-17 DIAGNOSIS — R310 Gross hematuria: Secondary | ICD-10-CM | POA: Diagnosis present

## 2021-08-17 DIAGNOSIS — Y846 Urinary catheterization as the cause of abnormal reaction of the patient, or of later complication, without mention of misadventure at the time of the procedure: Secondary | ICD-10-CM | POA: Diagnosis present

## 2021-08-17 DIAGNOSIS — I4891 Unspecified atrial fibrillation: Secondary | ICD-10-CM | POA: Diagnosis present

## 2021-08-17 DIAGNOSIS — J9601 Acute respiratory failure with hypoxia: Secondary | ICD-10-CM | POA: Diagnosis present

## 2021-08-17 DIAGNOSIS — I2510 Atherosclerotic heart disease of native coronary artery without angina pectoris: Secondary | ICD-10-CM | POA: Diagnosis present

## 2021-08-17 DIAGNOSIS — I10 Essential (primary) hypertension: Secondary | ICD-10-CM | POA: Diagnosis not present

## 2021-08-17 DIAGNOSIS — I5043 Acute on chronic combined systolic (congestive) and diastolic (congestive) heart failure: Secondary | ICD-10-CM | POA: Diagnosis present

## 2021-08-17 DIAGNOSIS — R0600 Dyspnea, unspecified: Secondary | ICD-10-CM

## 2021-08-17 DIAGNOSIS — R1030 Lower abdominal pain, unspecified: Secondary | ICD-10-CM | POA: Diagnosis present

## 2021-08-17 DIAGNOSIS — S3730XA Unspecified injury of urethra, initial encounter: Secondary | ICD-10-CM | POA: Diagnosis present

## 2021-08-17 DIAGNOSIS — I5033 Acute on chronic diastolic (congestive) heart failure: Secondary | ICD-10-CM | POA: Diagnosis not present

## 2021-08-17 DIAGNOSIS — Z936 Other artificial openings of urinary tract status: Secondary | ICD-10-CM | POA: Diagnosis not present

## 2021-08-17 DIAGNOSIS — N401 Enlarged prostate with lower urinary tract symptoms: Secondary | ICD-10-CM | POA: Diagnosis present

## 2021-08-17 DIAGNOSIS — Z7982 Long term (current) use of aspirin: Secondary | ICD-10-CM

## 2021-08-17 DIAGNOSIS — I82409 Acute embolism and thrombosis of unspecified deep veins of unspecified lower extremity: Secondary | ICD-10-CM

## 2021-08-17 LAB — BASIC METABOLIC PANEL
Anion gap: 12 (ref 5–15)
BUN: 22 mg/dL (ref 8–23)
CO2: 24 mmol/L (ref 22–32)
Calcium: 9.1 mg/dL (ref 8.9–10.3)
Chloride: 100 mmol/L (ref 98–111)
Creatinine, Ser: 1.33 mg/dL — ABNORMAL HIGH (ref 0.61–1.24)
GFR, Estimated: 51 mL/min — ABNORMAL LOW (ref >60)
Glucose, Bld: 163 mg/dL — ABNORMAL HIGH (ref 70–99)
Potassium: 3 mmol/L — ABNORMAL LOW (ref 3.5–5.1)
Sodium: 136 mmol/L (ref 135–145)

## 2021-08-17 LAB — CBC WITH DIFFERENTIAL/PLATELET
Abs Immature Granulocytes: 0.16 10*3/uL — ABNORMAL HIGH (ref 0.00–0.07)
Basophils Absolute: 0 10*3/uL (ref 0.0–0.1)
Basophils Relative: 0 %
Eosinophils Absolute: 0.1 10*3/uL (ref 0.0–0.5)
Eosinophils Relative: 1 %
HCT: 38.1 % — ABNORMAL LOW (ref 39.0–52.0)
Hemoglobin: 13.1 g/dL (ref 13.0–17.0)
Immature Granulocytes: 1 %
Lymphocytes Relative: 5 %
Lymphs Abs: 0.6 10*3/uL — ABNORMAL LOW (ref 0.7–4.0)
MCH: 32.8 pg (ref 26.0–34.0)
MCHC: 34.4 g/dL (ref 30.0–36.0)
MCV: 95.5 fL (ref 80.0–100.0)
Monocytes Absolute: 0.5 10*3/uL (ref 0.1–1.0)
Monocytes Relative: 4 %
Neutro Abs: 12 10*3/uL — ABNORMAL HIGH (ref 1.7–7.7)
Neutrophils Relative %: 89 %
Platelets: 144 10*3/uL — ABNORMAL LOW (ref 150–400)
RBC: 3.99 MIL/uL — ABNORMAL LOW (ref 4.22–5.81)
RDW: 14.6 % (ref 11.5–15.5)
WBC: 13.3 10*3/uL — ABNORMAL HIGH (ref 4.0–10.5)
nRBC: 0 % (ref 0.0–0.2)

## 2021-08-17 LAB — ECHOCARDIOGRAM COMPLETE
AR max vel: 3.08 cm2
AV Area VTI: 3.63 cm2
AV Area mean vel: 3.39 cm2
AV Mean grad: 2 mmHg
AV Peak grad: 3.7 mmHg
Ao pk vel: 0.96 m/s
Area-P 1/2: 3.79 cm2
Height: 67 in
MV VTI: 2.3 cm2
S' Lateral: 3.2 cm
Weight: 2464 oz

## 2021-08-17 LAB — RESP PANEL BY RT-PCR (FLU A&B, COVID) ARPGX2
Influenza A by PCR: NEGATIVE
Influenza B by PCR: NEGATIVE
SARS Coronavirus 2 by RT PCR: NEGATIVE

## 2021-08-17 LAB — TROPONIN I (HIGH SENSITIVITY): Troponin I (High Sensitivity): 11 ng/L (ref <18)

## 2021-08-17 MED ORDER — METOPROLOL SUCCINATE ER 25 MG PO TB24
12.5000 mg | ORAL_TABLET | Freq: Every day | ORAL | Status: DC
  Administered 2021-08-17 – 2021-08-22 (×5): 12.5 mg via ORAL
  Filled 2021-08-17: qty 0.5
  Filled 2021-08-17 (×2): qty 1
  Filled 2021-08-17: qty 0.5
  Filled 2021-08-17 (×2): qty 1

## 2021-08-17 MED ORDER — FINASTERIDE 5 MG PO TABS
5.0000 mg | ORAL_TABLET | Freq: Every day | ORAL | Status: DC
  Administered 2021-08-17 – 2021-08-22 (×6): 5 mg via ORAL
  Filled 2021-08-17 (×7): qty 1

## 2021-08-17 MED ORDER — DOXAZOSIN MESYLATE 4 MG PO TABS
4.0000 mg | ORAL_TABLET | Freq: Every day | ORAL | Status: DC
  Administered 2021-08-17 – 2021-08-21 (×5): 4 mg via ORAL
  Filled 2021-08-17 (×6): qty 1

## 2021-08-17 MED ORDER — DARIFENACIN HYDROBROMIDE ER 7.5 MG PO TB24
7.5000 mg | ORAL_TABLET | Freq: Every day | ORAL | Status: DC
  Administered 2021-08-17 – 2021-08-22 (×6): 7.5 mg via ORAL
  Filled 2021-08-17 (×7): qty 1

## 2021-08-17 MED ORDER — SODIUM CHLORIDE 0.9 % IV SOLN: 250.0000 mL | INTRAVENOUS | Status: DC | PRN

## 2021-08-17 MED ORDER — SODIUM CHLORIDE 0.9% FLUSH: 3.0000 mL | INTRAVENOUS | Status: DC | PRN

## 2021-08-17 MED ORDER — FUROSEMIDE 10 MG/ML IJ SOLN
20.0000 mg | Freq: Once | INTRAMUSCULAR | Status: AC
  Administered 2021-08-17: 09:00:00 20 mg via INTRAVENOUS
  Filled 2021-08-17: qty 4

## 2021-08-17 MED ORDER — FLUTICASONE PROPIONATE 50 MCG/ACT NA SUSP
1.0000 | Freq: Every day | NASAL | Status: DC | PRN
  Filled 2021-08-17: qty 16

## 2021-08-17 MED ORDER — POTASSIUM CHLORIDE CRYS ER 20 MEQ PO TBCR
40.0000 meq | EXTENDED_RELEASE_TABLET | Freq: Once | ORAL | Status: AC
  Administered 2021-08-17: 06:00:00 40 meq via ORAL
  Filled 2021-08-17: qty 2

## 2021-08-17 MED ORDER — ONDANSETRON HCL 4 MG PO TABS: 4.0000 mg | ORAL_TABLET | Freq: Four times a day (QID) | ORAL | Status: DC | PRN

## 2021-08-17 MED ORDER — POTASSIUM CHLORIDE CRYS ER 20 MEQ PO TBCR: 40.0000 meq | EXTENDED_RELEASE_TABLET | Freq: Once | ORAL | Status: DC

## 2021-08-17 MED ORDER — LIDOCAINE HCL URETHRAL/MUCOSAL 2 % EX GEL
1.0000 "application " | Freq: Once | CUTANEOUS | Status: AC
  Administered 2021-08-17: 1 via URETHRAL
  Filled 2021-08-17: qty 10

## 2021-08-17 MED ORDER — FUROSEMIDE 10 MG/ML IJ SOLN
20.0000 mg | Freq: Two times a day (BID) | INTRAMUSCULAR | Status: DC
  Administered 2021-08-17 – 2021-08-20 (×6): 20 mg via INTRAVENOUS
  Filled 2021-08-17: qty 2
  Filled 2021-08-17 (×2): qty 4
  Filled 2021-08-17 (×3): qty 2

## 2021-08-17 MED ORDER — MAGNESIUM SULFATE 2 GM/50ML IV SOLN
2.0000 g | Freq: Once | INTRAVENOUS | Status: AC
  Administered 2021-08-17: 06:00:00 2 g via INTRAVENOUS
  Filled 2021-08-17: qty 50

## 2021-08-17 MED ORDER — ISOSORBIDE MONONITRATE ER 30 MG PO TB24
30.0000 mg | ORAL_TABLET | Freq: Every day | ORAL | Status: DC
  Administered 2021-08-17 – 2021-08-22 (×5): 30 mg via ORAL
  Filled 2021-08-17 (×7): qty 1

## 2021-08-17 MED ORDER — ONDANSETRON HCL 4 MG/2ML IJ SOLN: 4.0000 mg | Freq: Four times a day (QID) | INTRAMUSCULAR | Status: DC | PRN

## 2021-08-17 MED ORDER — POTASSIUM CHLORIDE CRYS ER 20 MEQ PO TBCR
40.0000 meq | EXTENDED_RELEASE_TABLET | Freq: Every day | ORAL | Status: DC
  Administered 2021-08-18 – 2021-08-22 (×5): 40 meq via ORAL
  Filled 2021-08-17 (×7): qty 2

## 2021-08-17 MED ORDER — POTASSIUM CHLORIDE 10 MEQ/100ML IV SOLN
10.0000 meq | Freq: Once | INTRAVENOUS | Status: AC
Start: 2021-08-17 — End: 2021-08-17
  Administered 2021-08-17: 06:00:00 10 meq via INTRAVENOUS
  Filled 2021-08-17: qty 100

## 2021-08-17 MED ORDER — SODIUM CHLORIDE 0.9% FLUSH
3.0000 mL | Freq: Two times a day (BID) | INTRAVENOUS | Status: DC
  Administered 2021-08-17 – 2021-08-22 (×11): 3 mL via INTRAVENOUS

## 2021-08-17 MED ORDER — NITROGLYCERIN 0.4 MG SL SUBL: 0.4000 mg | SUBLINGUAL_TABLET | SUBLINGUAL | Status: DC | PRN

## 2021-08-17 MED ORDER — ADULT MULTIVITAMIN W/MINERALS CH
1.0000 | ORAL_TABLET | Freq: Every day | ORAL | Status: DC
  Administered 2021-08-17 – 2021-08-22 (×6): 1 via ORAL
  Filled 2021-08-17 (×8): qty 1

## 2021-08-17 NOTE — ED Notes (Signed)
Pt foley flushed, foley leaking at sight.

## 2021-08-17 NOTE — H&P (Signed)
History and Physical    James Mata [id number] DOB: Mar 02, 1931 DOA: 08/17/2021  PCP: Lenard Simmer, MD   Patient coming from: Home  I have personally briefly reviewed patient's old medical records in [place]  Chief Complaint: " Blood in my Foley catheter"  HPI: James Mata is a 88 y.o. male with medical history significant for BPH with obstructive symptoms status post chronic indwelling Foley catheter that was recently changed, hypertension who presents to the ER via private vehicle for evaluation of abdominal pain and bloody urine. Abdominal pain was worst in the suprapubic area and described as sharp in intensity.  He rated a 10 x 10 in intensity at its worst.  He also had hematuria.  Foley catheter was clogged by the time patient arrived in the ER and had to be changed and patient started on CBI. Complains of bilateral lower extremity swelling and takes Lasix 80 mg twice a day.  He admits to being compliant with prescribed medications. He denies having any fever or chills, no cough, no shortness of breath, no chest pain, no nausea, no vomiting, no changes in his bowel habits, no dizziness or lightheadedness. Room air pulse ox was 87% upon arrival to the ER he was placed on 4 L of oxygen with improvement in his pulse oximetry. Sodium 136, potassium 3.0, chloride 100, bicarb 24, glucose 163, BUN 22, creatinine 1.3, calcium 9.1, troponin 11, white count 13.3, hemoglobin 13.1, hematocrit 38.1, MCV 95.5, RDW 14.6, platelet count 144 Respiratory viral panel is negative Chest x-ray reviewed by me shows bibasilar atelectasis or scarring.  No focal consolidation.  Mild cardiomegaly. EKG reviewed by me shows A. fib, incomplete right bundle branch block, low voltage QRS.   ED Course: Patient is an 88 year old male who presents to the ER for evaluation of suprapubic abdominal pain and hematuria.  He has a history of BPH with a chronic indwelling Foley catheter was exchanged  the day prior to his admission. He was hypoxic upon arrival to the ER with room air pulse oximetry of 87% and has positive JVD with bilateral lower extremity swelling. He is currently on 3 L of oxygen to maintain pulse oximetry greater than 92%. He received a dose of IV Lasix in the ER as well as potassium supplementation. He will be admitted to the hospital for further evaluation.  Review of Systems: As per HPI otherwise all other systems reviewed and negative.    Past Medical History:  Diagnosis Date   CHF (congestive heart failure) ([place])    Complication of anesthesia    had trouble waking up in 2014   Enlarged prostate    Hyperlipidemia    Hypertension     Past Surgical History:  Procedure Laterality Date   CORONARY ANGIOPLASTY WITH STENT PLACEMENT     CYSTOSCOPY N/A 08/11/2020   Procedure: CYSTOSCOPY;  Surgeon: Abbie Sons, MD;  Location: [facility];  Service: Urology;  Laterality: N/A;   CYSTOSCOPY WITH DIRECT VISION INTERNAL URETHROTOMY N/A 08/11/2020   Procedure: CYSTOSCOPY WITH DIRECT VISION INTERNAL URETHROTOMY;  Surgeon: Abbie Sons, MD;  Location: [facility];  Service: Urology;  Laterality: N/A;   CYSTOSCOPY WITH URETHRAL DILATATION N/A 08/11/2020   Procedure: CYSTOSCOPY WITH URETHRAL DILATATION;  Surgeon: Abbie Sons, MD;  Location: [facility];  Service: Urology;  Laterality: N/A;   HERNIA REPAIR     PROSTATE ABLATION       reports that he has never smoked. He has never used smokeless tobacco.  He reports that he does not currently use alcohol. He reports that he does not currently use drugs.  No Known Allergies  History reviewed. No pertinent family history.  Parents are deceased   Prior to Admission medications   Medication Sig Start Date End Date Taking? Authorizing Provider  aspirin 81 MG EC tablet Take 81 mg by mouth daily.    [provider]  cyclobenzaprine (FLEXERIL) 5 MG tablet Take 5 mg by mouth at bedtime. 02/04/20   [provider]  doxazosin (CARDURA) 4 MG tablet Take 4 mg by mouth at bedtime. 12/30/19   [provider]  finasteride (PROSCAR) 5 MG tablet TAKE 1 TABLET (5 MG TOTAL) BY MOUTH DAILY. 06/28/21   Vaillancourt, Aldona Bar, PA-C  fluticasone (FLONASE) 50 MCG/ACT nasal spray Place 1 spray into both nostrils daily as needed for allergies or rhinitis.    [provider]  furosemide (LASIX) 80 MG tablet Take 80 mg by mouth 2 (two) times daily. 12/21/19   [provider]  isosorbide mononitrate (IMDUR) 60 MG 24 hr tablet Take 30 mg by mouth daily. 12/21/19   [provider]  metoprolol succinate (TOPROL-XL) 25 MG 24 hr tablet Take 12.5 mg by mouth daily. 12/21/19   [provider]  Molnupiravir 200 MG CAPS TAKE 4 CAPSULES BY MOUTH EVERY 12 HOURS FOR 5 DAYS. TAKE WITH OR WITHOUT FOOD. 09/09/20 09/09/21  Morayati, Lourdes Sledge, MD  mometasone (ELOCON) 0.1 % cream Apply topically. 04/22/21   [provider]  Multiple Vitamins-Minerals (MULTIVITAMIN WITH MINERALS) tablet Take 1 tablet by mouth daily.    [provider]  nitroGLYCERIN (NITROSTAT) 0.4 MG SL tablet Place 0.4 mg under the tongue every 5 (five) minutes as needed for chest pain. 02/04/20   [provider]  traMADol (ULTRAM) 50 MG tablet Take 50 mg by mouth 2 (two) times daily as needed for severe pain. 01/01/20   [provider]  trospium (SANCTURA) 20 MG tablet Take 1 tablet (20 mg total) by mouth daily. 07/09/21   Debroah Loop, PA-C    Physical Exam: Vitals:   08/17/21 0620 08/17/21 0625 08/17/21 0630 08/17/21 0800  BP:  (!) 107/51 (!) 112/58 122/66  Pulse: 77 78 80 70  Resp: (!) 21 19 18 19   Temp:    98.9 F (37.2 C)  TempSrc:    Tympanic  SpO2: 98% 93% 94% 94%  Weight:      Height:         Vitals:   08/17/21 0620 08/17/21 0625 08/17/21 0630 08/17/21 0800  BP:  (!) 107/51 (!) 112/58 122/66  Pulse: 77 78 80 70  Resp: (!) 21 19 18 19   Temp:    98.9 F (37.2 C)  TempSrc:     Tympanic  SpO2: 98% 93% 94% 94%  Weight:      Height:          Constitutional: Alert and oriented x 3 . Not in any apparent distress HEENT:      Head: Normocephalic and atraumatic.         Eyes: PERLA, EOMI, Conjunctivae are normal. Sclera is non-icteric.       Mouth/Throat: Mucous membranes are moist.       Neck: Supple with no signs of meningismus.  Positive JVD Cardiovascular: Irregularly irregular. No murmurs, gallops, or rubs. 2+ symmetrical distal pulses are present . No JVD. 2+ LE edema Respiratory: Respiratory effort normal .crackles in both lung bases bilaterally. No wheezes or rhonchi.  Gastrointestinal:  Soft, non tender, and non distended with positive bowel sounds.  Genitourinary: No CVA tenderness.  Foley catheter in place Musculoskeletal: Nontender with normal range of motion in all extremities. No cyanosis, or erythema of extremities. Neurologic:  Face is symmetric. Moving all extremities. No gross focal neurologic deficits . Skin: Skin is warm, dry.  No rash or ulcers Psychiatric: Mood and affect are normal.  Presenting to the ER with   Labs on Admission: I have personally reviewed following labs and imaging studies  CBC: Recent Labs  Lab 08/17/21 0406  WBC 13.3*  NEUTROABS 12.0*  HGB 13.1  HCT 38.1*  MCV 95.5  PLT 123456*   Basic Metabolic Panel: Recent Labs  Lab 08/17/21 0406  NA 136  K 3.0*  CL 100  CO2 24  GLUCOSE 163*  BUN 22  CREATININE 1.33*  CALCIUM 9.1   GFR: Estimated Creatinine Clearance: 35.9 mL/min (A) (by C-G formula based on SCr of 1.33 mg/dL (H)). Liver Function Tests: No results for input(s): AST, ALT, ALKPHOS, BILITOT, PROT, ALBUMIN in the last 168 hours. No results for input(s): LIPASE, AMYLASE in the last 168 hours. No results for input(s): AMMONIA in the last 168 hours. Coagulation Profile: No results for input(s): INR, PROTIME in the last 168 hours. Cardiac Enzymes: No results for input(s): CKTOTAL, CKMB, CKMBINDEX,  TROPONINI in the last 168 hours. BNP (last 3 results) No results for input(s): PROBNP in the last 8760 hours. HbA1C: No results for input(s): HGBA1C in the last 72 hours. CBG: No results for input(s): GLUCAP in the last 168 hours. Lipid Profile: No results for input(s): CHOL, HDL, LDLCALC, TRIG, CHOLHDL, LDLDIRECT in the last 72 hours. Thyroid Function Tests: No results for input(s): TSH, T4TOTAL, FREET4, T3FREE, THYROIDAB in the last 72 hours. Anemia Panel: No results for input(s): VITAMINB12, FOLATE, FERRITIN, TIBC, IRON, RETICCTPCT in the last 72 hours. Urine analysis:    Component Value Date/Time   COLORURINE RED (A) 05/30/2021 0833   APPEARANCEUR CLOUDY (A) 05/30/2021 0833   APPEARANCEUR Cloudy (A) 08/05/2020 1618   LABSPEC 1.010 05/30/2021 0833   PHURINE  05/30/2021 0833    TEST NOT REPORTED DUE TO COLOR INTERFERENCE OF URINE PIGMENT   GLUCOSEU (A) 05/30/2021 0833    TEST NOT REPORTED DUE TO COLOR INTERFERENCE OF URINE PIGMENT   HGBUR (A) 05/30/2021 0833    TEST NOT REPORTED DUE TO COLOR INTERFERENCE OF URINE PIGMENT   BILIRUBINUR (A) 05/30/2021 0833    TEST NOT REPORTED DUE TO COLOR INTERFERENCE OF URINE PIGMENT   BILIRUBINUR Negative 08/05/2020 1618   KETONESUR (A) 05/30/2021 0833    TEST NOT REPORTED DUE TO COLOR INTERFERENCE OF URINE PIGMENT   PROTEINUR (A) 05/30/2021 0833    TEST NOT REPORTED DUE TO COLOR INTERFERENCE OF URINE PIGMENT   NITRITE (A) 05/30/2021 0833    TEST NOT REPORTED DUE TO COLOR INTERFERENCE OF URINE PIGMENT   LEUKOCYTESUR (A) 05/30/2021 0833    TEST NOT REPORTED DUE TO COLOR INTERFERENCE OF URINE PIGMENT    Radiological Exams on Admission: DG Chest Portable 1 View  Result Date: 08/17/2021 CLINICAL DATA:  Hypoxia. EXAM: PORTABLE CHEST 1 VIEW COMPARISON:  None. FINDINGS: Bilateral lower lung field interstitial prominence may represent atelectasis or scarring. Developing infiltrate is less likely but not excluded clinical correlation is  recommended. No focal consolidation, pleural effusion, or pneumothorax. Mild cardiomegaly. Atherosclerotic calcification of the aorta. No acute osseous pathology. IMPRESSION: 1. Bibasilar atelectasis or scarring. No focal consolidation. 2. Mild cardiomegaly. Electronically Signed  By: Anner Crete M.D.   On: 08/17/2021 03:19     Assessment/Plan Principal Problem:   Acute exacerbation of CHF (congestive heart failure) (HCC) Active Problems:   BPH with obstruction/lower urinary tract symptoms   Hematuria, gross   Hypertension   Unspecified atrial fibrillation [facility])     Patient is an 88 year old male admitted to the hospital for evaluation of gross hematuria and acute CHF    Acute CHF Unclear etiology No recent 2D echocardiogram on file Patient with significant lower extremity swelling and JVD BNP is pending We will place patient on Lasix 20 mg IV twice daily Continue metoprolol and nitrates Obtain 2D echocardiogram to assess LVEF    BPH with obstructive urinary symptoms status post chronic indwelling Foley catheter/gross hematuria Patient has a chronic indwelling Foley catheter in place obstructive symptoms and was recently exchanged 1 day prior to his admission. Patient presented to the ER with obstructive urinary symptoms and gross hematuria and has a CBI in place Continue finasteride, Enablex and Cardura Will consult urology Hold Aspirin     Atrial fibrillation Rate controlled Continue metoprolol for rate control Patient has a CHA2DS2-VASc of 4 and ideally will require long-term anticoagulation as primary prophylaxis for an acute stroke Hold off on anticoagulation for now due to gross hematuria Follow-up results of 2D echocardiogram to rule out valvular pathology and assess LVEF     Coronary artery disease Continue nitrates and beta-blockers Aspirin is on hold due to gross hematuria    DVT prophylaxis: SCD  Code Status: full code  Family Communication:  Greater than 50% of time was spent discussing patient's condition and plan of care with him at the bedside.  All questions and concerns have been addressed.  He verbalizes understanding and agrees to the plan. Disposition Plan: Back to previous home environment Consults called: Urology  Status:At the time of admission, it appears that the appropriate admission status for this patient is inpatient. This is judged to be reasonable and necessary to provide the required intensity of service to ensure the patient's safety given the presenting symptoms, physical exam findings, and initial radiographic and laboratory data in the context of their comorbid conditions. Patient requires inpatient status due to high intensity of service, high risk for further deterioration and high frequency of surveillance required.     Collier Bullock MD [facility]     08/17/2021, 8:33 AM

## 2021-08-17 NOTE — ED Provider Notes (Signed)
[facility] Provider Note    Event Date/Time   First MD Initiated Contact with Patient 08/17/21 0245     (approximate)   History   Urinary Retention   HPI  James Mata is a 88 y.o. male with a history of CHF, hypertension, hyperlipidemia, BPH with chronic urinary retention with indwelling Foley catheter who presents for evaluation of obstructed catheter.  The catheter was exchanged earlier this morning.  Patient started having hematuria and now the catheter is clogged.  He is complaining lower abdominal distention and sharp pain.  Patient is not on blood thinners.  Patient denies chest pain, shortness of breath, dysuria hematuria, nausea or vomiting, fever or chills.     Past Medical History:  Diagnosis Date   CHF (congestive heart failure) ([place])    Complication of anesthesia    had trouble waking up in 2014   Enlarged prostate    Hyperlipidemia    Hypertension     Past Surgical History:  Procedure Laterality Date   CORONARY ANGIOPLASTY WITH STENT PLACEMENT     CYSTOSCOPY N/A 08/11/2020   Procedure: CYSTOSCOPY;  Surgeon: Abbie Sons, MD;  Location: [facility] ORS;  Service: Urology;  Laterality: N/A;   CYSTOSCOPY WITH DIRECT VISION INTERNAL URETHROTOMY N/A 08/11/2020   Procedure: CYSTOSCOPY WITH DIRECT VISION INTERNAL URETHROTOMY;  Surgeon: Abbie Sons, MD;  Location: [facility] ORS;  Service: Urology;  Laterality: N/A;   CYSTOSCOPY WITH URETHRAL DILATATION N/A 08/11/2020   Procedure: CYSTOSCOPY WITH URETHRAL DILATATION;  Surgeon: Abbie Sons, MD;  Location: [facility] ORS;  Service: Urology;  Laterality: N/A;   HERNIA REPAIR     PROSTATE ABLATION       Physical Exam   Triage Vital Signs: ED Triage Vitals  Enc Vitals Group     BP 08/17/21 0235 (!) 122/47     Pulse Rate 08/17/21 0235 (!) 105     Resp 08/17/21 0235 (!) 22     Temp 08/17/21 0235 97.8 F (36.6 C)     Temp Source 08/17/21 0235 Oral     SpO2 08/17/21 0238 (!) 87 %     Weight  08/17/21 0236 154 lb (69.9 kg)     Height 08/17/21 0236 5\' 7"  (1.702 m)     Head Circumference --      Peak Flow --      Pain Score 08/17/21 0236 8     Pain Loc --      Pain Edu? --      Excl. in [place]? --     Most recent vital signs: Vitals:   08/17/21 0400 08/17/21 0500  BP: (!) 132/59 119/73  Pulse: 63 68  Resp: (!) 21 15  Temp:    SpO2: 92% 95%     Constitutional: Alert and oriented. Well appearing and in no apparent distress. HEENT:      Head: Normocephalic and atraumatic.         Eyes: Conjunctivae are normal. Sclera is non-icteric.       Mouth/Throat: Mucous membranes are moist.       Neck: Supple with no signs of meningismus. Cardiovascular: Regular rate and rhythm. No murmurs, gallops, or rubs. 2+ symmetrical distal pulses are present in all extremities.  Respiratory: Normal respiratory effort.  Hypoxic with sats in the low 80s on room air, no wheezing or crackles bilaterally gastrointestinal: Soft, bladder distention palpable on exam with positive bowel sounds. No rebound or guarding. Genitourinary: 86 French Foley in place with small  amount of hematuria in the bag Musculoskeletal: 3+ pitting edema bilateral Neurologic: Normal speech and language. Face is symmetric. Moving all extremities. No gross focal neurologic deficits are appreciated. Skin: Skin is warm, dry and intact. No rash noted. Psychiatric: Mood and affect are normal. Speech and behavior are normal.  ED Results / Procedures / Treatments   Labs (all labs ordered are listed, but only abnormal results are displayed) Labs Reviewed  CBC WITH DIFFERENTIAL/PLATELET - Abnormal; Notable for the following components:      Result Value   WBC 13.3 (*)    RBC 3.99 (*)    HCT 38.1 (*)    Platelets 144 (*)    Neutro Abs 12.0 (*)    Lymphs Abs 0.6 (*)    Abs Immature Granulocytes 0.16 (*)    All other components within normal limits  BASIC METABOLIC PANEL - Abnormal; Notable for the following components:    Potassium 3.0 (*)    Glucose, Bld 163 (*)    Creatinine, Ser 1.33 (*)    GFR, Estimated 51 (*)    All other components within normal limits  URINE CULTURE  RESP PANEL BY RT-PCR (FLU A&B, COVID) ARPGX2  URINALYSIS, COMPLETE (UACMP) WITH MICROSCOPIC  BRAIN NATRIURETIC PEPTIDE  TROPONIN I (HIGH SENSITIVITY)  TROPONIN I (HIGH SENSITIVITY)     EKG  ED ECG REPORT I, Rudene Re, the attending physician, personally viewed and interpreted this ECG.  Atrial fibrillation with a rate of 74, left axis deviation, occasional PVCs, no ST elevations or depressions no prior for comparison.   RADIOLOGY I, Rudene Re, attending MD, have personally viewed and interpreted the images obtained during this visit as below:  Bilateral infiltrates on chest x-ray and mild cardiomegaly   ___________________________________________________ Interpretation by Radiologist:  DG Chest Portable 1 View  Result Date: 08/17/2021 CLINICAL DATA:  Hypoxia. EXAM: PORTABLE CHEST 1 VIEW COMPARISON:  None. FINDINGS: Bilateral lower lung field interstitial prominence may represent atelectasis or scarring. Developing infiltrate is less likely but not excluded clinical correlation is recommended. No focal consolidation, pleural effusion, or pneumothorax. Mild cardiomegaly. Atherosclerotic calcification of the aorta. No acute osseous pathology. IMPRESSION: 1. Bibasilar atelectasis or scarring. No focal consolidation. 2. Mild cardiomegaly. Electronically Signed   By: Anner Crete M.D.   On: 08/17/2021 03:19      PROCEDURES:  Critical Care performed: Yes, see critical care procedure note(s)  .Critical Care Performed by: Rudene Re, MD Authorized by: Rudene Re, MD   Critical care provider statement:    Critical care time (minutes):  40   Critical care was necessary to treat or prevent imminent or life-threatening deterioration of the following conditions:  Renal failure, cardiac failure,  circulatory failure, respiratory failure, sepsis, shock, metabolic crisis and dehydration   Critical care was time spent personally by me on the following activities:  Development of treatment plan with patient or surrogate, discussions with consultants, evaluation of patient's response to treatment, examination of patient, ordering and review of laboratory studies, ordering and review of radiographic studies, ordering and performing treatments and interventions, pulse oximetry, re-evaluation of patient's condition, review of old charts and obtaining history from patient or surrogate   I assumed direction of critical care for this patient from another provider in my specialty: no     Care discussed with: admitting provider      IMPRESSION / MDM / [place] / ED COURSE  I reviewed the triage vital signs and the nursing notes.  88 y.o. male with a  history of CHF, hypertension, hyperlipidemia, BPH with chronic urinary retention with indwelling Foley catheter who presents for evaluation of obstructed catheter.  Patient had his catheter exchanged earlier today.  Currently has gross hematuria nonobstructive catheter.  Patient was also noticed to be hypoxic on room air although he denies any shortness of breath.  He does not use oxygen at home.  He does have 3+ pitting edema bilateral lower extremity which according to him that is normal.  His lungs are clear to auscultation with no wheezing or crackles.  Ddx: Foley obstruction due to blood clots or dislodged.  #Hypoxia: CHF exacerbation, lung compression from bladder distention, PNA, edema, ACS   Plan: We will try to flush Foley catheter and if that does not resolve the obstruction we will replace with a three-way system for irrigation.  We will send urinalysis to rule out UTI.  For the hypoxia we will get an EKG, chest x-ray, BMP, BNP, troponin.  Patient was started on supplemental oxygen currently at 4 L satting in the low 90s.  We will  continue to monitor throughout telemetry for any signs of worsening hypoxia.   MEDICATIONS GIVEN IN ED: Medications  magnesium sulfate IVPB 2 g 50 mL (has no administration in time range)  potassium chloride SA (KLOR-CON M) CR tablet 40 mEq (has no administration in time range)  potassium chloride 10 mEq in 100 mL IVPB (has no administration in time range)  furosemide (LASIX) injection 20 mg (has no administration in time range)  lidocaine (XYLOCAINE) 2 % jelly 1 application (1 application Urethral Given 08/17/21 0359)     ED COURSE: Foley catheter was removed and a three-way catheter was placed.  Patient is undergoing continuous irrigation.  He continues to be hypoxic requiring 3 L of oxygen.  According to the son the home health nurse had noticed the patient's oxygen was dropping over the last couple of days.  He looks grossly volume overloaded.  We will diurese with IV Lasix.  EKG showing new onset of A. fib with well-controlled rates.  Patient has mild hypokalemia.  Will give IV magnesium 2 g, p.o. and IV potassium.  We will keep patient on telemetry for close monitoring of hemodynamically stability.  Chest x-ray showing signs of CHF but no pneumonia.  Unfortunately patient's cardiologist does not have epic and I do not have access to his echo or cardiac history.   Consults: Hospitalist   EMR reviewed including last visit to urology from December 2022.  Unfortunately cardiology records are not available through epic    FINAL CLINICAL IMPRESSION(S) / ED DIAGNOSES   Final diagnoses:  Acute respiratory failure with hypoxia ([place])  Acute on chronic congestive heart failure, unspecified heart failure type ([place])  Gross hematuria  Hypokalemia  New onset a-fib ([place])     Rx / DC Orders   ED Discharge Orders     None        Note:  This document was prepared using Dragon voice recognition software and may include unintentional dictation errors.   Please note:  Patient was  evaluated in Emergency Department today for the symptoms described in the history of present illness. Patient was evaluated in the context of the global COVID-19 pandemic, which necessitated consideration that the patient might be at risk for infection with the SARS-CoV-2 virus that causes COVID-19. Institutional protocols and algorithms that pertain to the evaluation of patients at risk for COVID-19 are in a state of rapid change based on information released by regulatory  bodies including the CDC and federal and state organizations. These policies and algorithms were followed during the patient's care in the ED.  Some ED evaluations and interventions may be delayed as a result of limited staffing during the pandemic.       Alfred Levins, [place], MD 08/17/21 [phone]

## 2021-08-17 NOTE — ED Notes (Signed)
Admitting MD notified via Epic Chat that patient's CBI continues to clot off every few minutes.

## 2021-08-17 NOTE — Consult Note (Signed)
Urology Consult  I have been asked to see the patient by Dr.Agbata , for evaluation and management of gross hematuria.  Chief Complaint: Foley catheter malfunction  History of Present Illness: James Mata is a 88 y.o. year old male with chronic indwelling Foley secondary to BPH and chronic retention admitted for shortness of breath/hypoxia but originally came to the emergency room for Foley catheter issues.  He has had some clogging of his catheter in the recent past and is being seen often in the urology clinic by our PA, Sam Vaillancourt.  His son noted yesterday that his catheter was not draining, tried to irrigate the catheter unsuccessfully.  The patient reports that the catheter was then exchanged by home health at which time bleeding was noted.  He ultimately came to the emergency room last night with a nondraining Foley catheter and hematuria.  Nursing staff initially change out the Foley catheter to a 20 [place] three-way which continue to clot off after CBI was initiated.  Ultimately, it appears that this was exchanged for a 24 [place] reinforced hematuria catheter which seems to be draining well currently.  [name] does have a personal history of massive prostamegaly and managed with the below multiple GU medications.  His prostate measures 160 g on TRUS and is undergone cystoscopy which reveals an enlarged tortuous prostatic urethra.  He ultimately elected to manage with a chronic indwelling Foley but is also considered an SP tube.  Another voiding trial was planned for next month as an outpatient.  He is status post PVP in 2014.  He also has personal history of postprocedural bladder neck contracture requiring dilation.  Past Medical History:  Diagnosis Date   CHF (congestive heart failure) ([place])    Complication of anesthesia    had trouble waking up in 2014   Enlarged prostate    Hyperlipidemia    Hypertension     Past Surgical History:  Procedure Laterality Date    CORONARY ANGIOPLASTY WITH STENT PLACEMENT     CYSTOSCOPY N/A 08/11/2020   Procedure: CYSTOSCOPY;  Surgeon: Abbie Sons, MD;  Location: [facility] ORS;  Service: Urology;  Laterality: N/A;   CYSTOSCOPY WITH DIRECT VISION INTERNAL URETHROTOMY N/A 08/11/2020   Procedure: CYSTOSCOPY WITH DIRECT VISION INTERNAL URETHROTOMY;  Surgeon: Abbie Sons, MD;  Location: [facility] ORS;  Service: Urology;  Laterality: N/A;   CYSTOSCOPY WITH URETHRAL DILATATION N/A 08/11/2020   Procedure: CYSTOSCOPY WITH URETHRAL DILATATION;  Surgeon: Abbie Sons, MD;  Location: [facility] ORS;  Service: Urology;  Laterality: N/A;   HERNIA REPAIR     PROSTATE ABLATION      Home GU Medications:  Trospium 20 mg Finasteride 5 mg  doxazosin 4 mg  Allergies: No Known Allergies  History reviewed. No pertinent family history.  Social History:  reports that he has never smoked. He has never used smokeless tobacco. He reports that he does not currently use alcohol. He reports that he does not currently use drugs.  ROS: A complete review of systems was performed.  All systems are negative except for pertinent findings as noted.  Physical Exam:  Vital signs in last 24 hours: Temp:  [97.8 F (36.6 C)-98.9 F (37.2 C)] 98.9 F (37.2 C) (01/24 0800) Pulse Rate:  [50-105] 70 (01/24 0800) Resp:  [15-22] 19 (01/24 0800) BP: (107-140)/(47-73) 122/66 (01/24 0800) SpO2:  [87 %-98 %] 94 % (01/24 0800) Weight:  [69.9 kg] 69.9 kg (01/24 0236) Constitutional:  Alert and oriented,  No acute distress HEENT: [place] AT, moist mucus membranes.  Trachea midline, no masses.  Hearing impaired. GI: Abdomen is soft, nontender, nondistended, no abdominal masses GU: 24 French hematuria catheter in place.  Was irrigated at bedside today with 200 cc of sterile water, few small clots were evacuated and the patient was resumed on a slow drip CBI which remained light pink. Neurologic: Grossly intact, no focal deficits, moving all 4 extremities Psychiatric:  Normal mood and affect   Laboratory Data:  Recent Labs    08/17/21 0406  WBC 13.3*  HGB 13.1  HCT 38.1*   Recent Labs    08/17/21 0406  NA 136  K 3.0*  CL 100  CO2 24  GLUCOSE 163*  BUN 22  CREATININE 1.33*  CALCIUM 9.1     Radiologic Imaging: No pertinent GU imaging  Impression/ Plan:  1.  Gross hematuria-likely secondary to catheter trauma, does not appear to be severe.  Hand irrigated at bedside with very minimal residual clot burden.  Slowed the drip down to slow drip.  Would maintain this overnight and then try to titrated off tomorrow.  Hand irrigate as needed.  Foley catheter continues to clot off, consider pelvic ultrasound to assess for significant clot burden and bladder although not suspected.  Last cystoscopy 07/2020, consider repeating as an outpatient.  2.  Chronic urinary retention-maintain Foley catheter the time of discharge.  Consider downsizing to more comfortable catheter size prior to discharge.  We will consider another voiding trial down the road as previously planned.  Continue finasteride and doxazosin.  3.  History of bladder spasm-on [place] as an outpatient but appears that this medication has been changed to Enablex, possibly formulary change.  Would strongly advise against this given the cognitive issues related to this anticholinergic unless he is having severe bladder spasm and discomfort at which time you could consider beta 3 agonist such as Myrbetriq or Gemtesa.  08/17/2021, 10:30 AM  Hollice Espy,  MD

## 2021-08-17 NOTE — Progress Notes (Signed)
*  PRELIMINARY RESULTS* Echocardiogram 2D Echocardiogram has been performed.  Cristela Blue 08/17/2021, 1:26 PM

## 2021-08-17 NOTE — ED Triage Notes (Signed)
Pt brought in by soninlaw.  Pt has a foley cath with blood in urine and abd pain.  Sx began today.  Pt alert.

## 2021-08-18 ENCOUNTER — Encounter: Payer: Self-pay | Admitting: Internal Medicine

## 2021-08-18 DIAGNOSIS — R310 Gross hematuria: Secondary | ICD-10-CM | POA: Diagnosis not present

## 2021-08-18 DIAGNOSIS — T839XXA Unspecified complication of genitourinary prosthetic device, implant and graft, initial encounter: Secondary | ICD-10-CM

## 2021-08-18 DIAGNOSIS — I509 Heart failure, unspecified: Secondary | ICD-10-CM | POA: Diagnosis not present

## 2021-08-18 DIAGNOSIS — I2510 Atherosclerotic heart disease of native coronary artery without angina pectoris: Secondary | ICD-10-CM | POA: Diagnosis present

## 2021-08-18 DIAGNOSIS — N401 Enlarged prostate with lower urinary tract symptoms: Secondary | ICD-10-CM | POA: Diagnosis not present

## 2021-08-18 DIAGNOSIS — R339 Retention of urine, unspecified: Secondary | ICD-10-CM | POA: Diagnosis not present

## 2021-08-18 LAB — CBC
HCT: 32.1 % — ABNORMAL LOW (ref 39.0–52.0)
Hemoglobin: 11 g/dL — ABNORMAL LOW (ref 13.0–17.0)
MCH: 32.4 pg (ref 26.0–34.0)
MCHC: 34.3 g/dL (ref 30.0–36.0)
MCV: 94.4 fL (ref 80.0–100.0)
Platelets: 137 10*3/uL — ABNORMAL LOW (ref 150–400)
RBC: 3.4 MIL/uL — ABNORMAL LOW (ref 4.22–5.81)
RDW: 14.6 % (ref 11.5–15.5)
WBC: 10.7 10*3/uL — ABNORMAL HIGH (ref 4.0–10.5)
nRBC: 0 % (ref 0.0–0.2)

## 2021-08-18 LAB — BRAIN NATRIURETIC PEPTIDE: B Natriuretic Peptide: 213.4 pg/mL — ABNORMAL HIGH (ref 0.0–100.0)

## 2021-08-18 LAB — BASIC METABOLIC PANEL
Anion gap: 8 (ref 5–15)
BUN: 19 mg/dL (ref 8–23)
CO2: 25 mmol/L (ref 22–32)
Calcium: 8.5 mg/dL — ABNORMAL LOW (ref 8.9–10.3)
Chloride: 104 mmol/L (ref 98–111)
Creatinine, Ser: 1.21 mg/dL (ref 0.61–1.24)
GFR, Estimated: 58 mL/min — ABNORMAL LOW (ref >60)
Glucose, Bld: 112 mg/dL — ABNORMAL HIGH (ref 70–99)
Potassium: 3.4 mmol/L — ABNORMAL LOW (ref 3.5–5.1)
Sodium: 137 mmol/L (ref 135–145)

## 2021-08-18 LAB — TROPONIN I (HIGH SENSITIVITY): Troponin I (High Sensitivity): 14 ng/L (ref <18)

## 2021-08-18 MED ORDER — CHLORHEXIDINE GLUCONATE CLOTH 2 % EX PADS
6.0000 | MEDICATED_PAD | Freq: Every day | CUTANEOUS | Status: DC
  Administered 2021-08-18 – 2021-08-22 (×5): 6 via TOPICAL

## 2021-08-18 NOTE — ED Notes (Signed)
Jacqui RN aware of assigned bed °

## 2021-08-18 NOTE — Assessment & Plan Note (Addendum)
Continue nitrates and beta-blockers Aspirin is on hold due to gross hematuria.  Resume aspirin tomorrow if still no bleeding  1/29: restart ASA at discharge.

## 2021-08-18 NOTE — Assessment & Plan Note (Addendum)
Pt has chronic indwelling Foley. Having obstructive symptoms after Foley was exchanged 1 day prior to this admission.  Presented to the ER with obstructive urinary symptoms and gross hematuria --Urology following, see their recs --CBI discontinued per urology --Irrigate Foley as needed --Continue finasteride, doxazosin, oxybutynin as needed for bladder spasms substituted for Enablex while inpatient --Hold Aspirin --No plan or indication for inpatient procedure at this time --Discharge with three-way catheter, capped inflow -- Outpatient follow-up in 1 to 2 weeks with Dr. Caprice Beaver for voiding trial and discussion of HOLEP

## 2021-08-18 NOTE — Progress Notes (Addendum)
Urology Consult Follow Up  Subjective: Patient resting comfortably in bed.  Urine is a light pink on a fast drip CBI.  ER states that during the night they had irrigated him a few times through the port with no clot return and the urine would lighten and then return to dark red later when they would try to titrate off the CBI.  VSS afebrile   His hemoglobin is down from 13.1 yesterday to 11.0 this morning.  Serum creatinine is improved from 1.33 yesterday to 1.21 this morning.  Anti-infectives: Anti-infectives (From admission, onward)    None       Current Facility-Administered Medications  Medication Dose Route Frequency Provider Last Rate Last Admin   0.9 %  sodium chloride infusion  250 mL Intravenous PRN Agbata, Tochukwu, MD       darifenacin (ENABLEX) 24 hr tablet 7.5 mg  7.5 mg Oral Daily Agbata, Tochukwu, MD   7.5 mg at 08/17/21 1247   doxazosin (CARDURA) tablet 4 mg  4 mg Oral QHS Agbata, Tochukwu, MD   4 mg at 08/17/21 2154   finasteride (PROSCAR) tablet 5 mg  5 mg Oral Daily Agbata, Tochukwu, MD   5 mg at 08/17/21 1247   fluticasone (FLONASE) 50 MCG/ACT nasal spray 1 spray  1 spray Each Nare Daily PRN Agbata, Tochukwu, MD       furosemide (LASIX) injection 20 mg  20 mg Intravenous Q12H Agbata, Tochukwu, MD   20 mg at 08/18/21 [id number]   isosorbide mononitrate (IMDUR) 24 hr tablet 30 mg  30 mg Oral Daily Agbata, Tochukwu, MD   30 mg at 08/17/21 1247   metoprolol succinate (TOPROL-XL) 24 hr tablet 12.5 mg  12.5 mg Oral Daily Agbata, Tochukwu, MD   12.5 mg at 08/17/21 1248   multivitamin with minerals tablet 1 tablet  1 tablet Oral Daily Agbata, Tochukwu, MD   1 tablet at 08/17/21 1247   nitroGLYCERIN (NITROSTAT) SL tablet 0.4 mg  0.4 mg Sublingual Q5 min PRN Agbata, Tochukwu, MD       ondansetron (ZOFRAN) tablet 4 mg  4 mg Oral Q6H PRN Agbata, Tochukwu, MD       Or   ondansetron (ZOFRAN) injection 4 mg  4 mg Intravenous Q6H PRN Agbata, Tochukwu, MD       potassium chloride SA  (KLOR-CON M) CR tablet 40 mEq  40 mEq Oral Daily Agbata, Tochukwu, MD       sodium chloride flush (NS) 0.9 % injection 3 mL  3 mL Intravenous Q12H Agbata, Tochukwu, MD   3 mL at 08/17/21 2157   sodium chloride flush (NS) 0.9 % injection 3 mL  3 mL Intravenous PRN Agbata, Tochukwu, MD       Current Outpatient Medications  Medication Sig Dispense Refill   acetaminophen (TYLENOL) 500 MG tablet Take 500 mg by mouth every 6 (six) hours as needed for mild pain, moderate pain, headache or fever. Take with tramadol in the morning     aspirin 81 MG EC tablet Take 81 mg by mouth daily.     doxazosin (CARDURA) 4 MG tablet Take 4 mg by mouth at bedtime.     finasteride (PROSCAR) 5 MG tablet TAKE 1 TABLET (5 MG TOTAL) BY MOUTH DAILY. 90 tablet 3   furosemide (LASIX) 80 MG tablet Take 80 mg by mouth 2 (two) times daily. AM WITH BREAKFAST, AND THEN 1 AT NOON/LUNCH     isosorbide mononitrate (IMDUR) 60 MG 24 hr tablet Take 30 mg by  mouth daily.     metoprolol succinate (TOPROL-XL) 25 MG 24 hr tablet Take 12.5 mg by mouth daily.     Multiple Vitamins-Minerals (MULTIVITAMIN WITH MINERALS) tablet Take 1 tablet by mouth daily.     traMADol (ULTRAM) 50 MG tablet Take 50 mg by mouth 2 (two) times daily as needed for severe pain.     trospium (SANCTURA) 20 MG tablet Take 1 tablet (20 mg total) by mouth daily. 30 tablet 11   cyclobenzaprine (FLEXERIL) 5 MG tablet Take 5 mg by mouth at bedtime.     fluticasone (FLONASE) 50 MCG/ACT nasal spray Place 1 spray into both nostrils daily as needed for allergies or rhinitis. (Patient not taking: Reported on 08/17/2021)     Molnupiravir 200 MG CAPS TAKE 4 CAPSULES BY MOUTH EVERY 12 HOURS FOR 5 DAYS. TAKE WITH OR WITHOUT FOOD. (Patient not taking: Reported on 08/17/2021) 40 capsule 0   mometasone (ELOCON) 0.1 % cream Apply topically. (Patient not taking: Reported on 08/17/2021)     nitroGLYCERIN (NITROSTAT) 0.4 MG SL tablet Place 0.4 mg under the tongue every 5 (five) minutes as  needed for chest pain.       Objective: Vital signs in last 24 hours: Temp:  [98.1 F (36.7 C)-99.1 F (37.3 C)] 98.2 F (36.8 C) (01/25 0518) Pulse Rate:  [51-71] 51 (01/25 0530) Resp:  [14-22] 16 (01/25 0530) BP: (106-122)/(49-70) 109/51 (01/25 0530) SpO2:  [91 %-100 %] 95 % (01/25 0530)  Intake/Output from previous day: 01/24 0701 - 01/25 0700 In: 3 [I.V.:3] Out: -  Intake/Output this shift: No intake/output data recorded.   Physical Exam Constitutional:  Well nourished. Alert and oriented, No acute distress. HEENT: [place] AT, moist mucus membranes.  Trachea midline Cardiovascular: No clubbing, cyanosis, or edema. Respiratory: Normal respiratory effort, no increased work of breathing. GU: No CVA tenderness.  No bladder fullness or masses.  Patient with uncircumcised phallus. Foreskin easily retracted.  20 Fr 3-way catheter in place.    Neurologic: Grossly intact, no focal deficits, moving all 4 extremities. Psychiatric: Normal mood and affect.   Lab Results:  Recent Labs    08/17/21 0406 08/18/21 0728  WBC 13.3* 10.7*  HGB 13.1 11.0*  HCT 38.1* 32.1*  PLT 144* 137*   BMET Recent Labs    08/17/21 0406 08/18/21 0728  NA 136 137  K 3.0* 3.4*  CL 100 104  CO2 24 25  GLUCOSE 163* 112*  BUN 22 19  CREATININE 1.33* 1.21  CALCIUM 9.1 8.5*   PT/INR No results for input(s): LABPROT, INR in the last 72 hours. ABG No results for input(s): PHART, HCO3 in the last 72 hours.  Invalid input(s): PCO2, PO2  Studies/Results: CT CHEST WO CONTRAST  Result Date: 08/17/2021 CLINICAL DATA:  Pneumonia, complications suspected, x-ray done. EXAM: CT CHEST WITHOUT CONTRAST TECHNIQUE: Multidetector CT imaging of the chest was performed following the standard protocol without IV contrast. RADIATION DOSE REDUCTION: This exam was performed according to the departmental dose-optimization program which includes automated exposure control, adjustment of the mA and/or kV according to  patient size and/or use of iterative reconstruction technique. COMPARISON:  Chest radiograph 08/17/2021 FINDINGS: Cardiovascular: Ascending thoracic aorta measures up to 3.8 cm. Descending thoracic aorta measures 3.3 cm. Atherosclerotic calcifications in the thoracic aorta. Heart size is enlarged with small to moderate amount of pericardial fluid. Diffuse coronary artery calcifications particularly in the LAD. Mediastinum/Nodes: Difficult to exclude supraclavicular lymph nodes versus enlarged venous structures particularly on the right side. Thyroid tissue  appears to be heterogeneous. Subcarinal tissue measures 1.1 cm in the short axis on sequence 2, image 64. Limited evaluation for hilar lymphadenopathy without intravascular contrast. No significant axillary lymph node enlargement. Lungs/Pleura: Trachea and mainstem bronchi are patent. Dependent densities in the lower lobes bilaterally. No significant pleural fluid. No significant airspace disease or consolidation in the lungs. 3 mm pleural-based calcification in the posterior left upper lobe on sequence 3 image 27. Upper Abdomen: Numerous calcified stones in the gallbladder. The gallbladder appears to be completely stone filled. No significant gallbladder inflammatory changes but the gallbladder is not completely imaged. Large low-density structure in the right renal hilum could represent a large parapelvic cyst measuring up to 4.1 cm but the kidneys are incompletely evaluated. Calcifications and fullness in the left adrenal gland is nonspecific Musculoskeletal: No acute bone abnormality. Disc space narrowing at C7-T1. IMPRESSION: 1. No acute abnormality in the chest. Dependent densities in both lower lobes are most compatible with atelectasis. No evidence for pneumonia. 2. Cardiomegaly with small to moderate amount of pericardial fluid. 3. Aortic Atherosclerosis (ICD10-I70.0). Coronary artery calcifications 4. Cholelithiasis. 5. Probable large right parapelvic  renal cyst but incompletely evaluated. Electronically Signed   By: Markus Daft M.D.   On: 08/17/2021 09:27   US Venous Img Lower Unilateral Left (DVT)  Result Date: 08/17/2021 CLINICAL DATA:  Bilateral lower extremity swelling, left greater than right. Clinical concern for DVT. EXAM: LEFT LOWER EXTREMITY VENOUS DOPPLER ULTRASOUND TECHNIQUE: Gray-scale sonography with compression, as well as color and duplex ultrasound, were performed to evaluate the deep venous system(s) from the level of the common femoral vein through the popliteal and proximal calf veins. COMPARISON:  None. FINDINGS: VENOUS Normal compressibility of the common femoral, superficial femoral, and popliteal veins, as well as the visualized calf veins. Visualized portions of profunda femoral vein and great saphenous vein unremarkable. No filling defects to suggest DVT on grayscale or color Doppler imaging. Doppler waveforms show normal direction of venous flow, normal respiratory plasticity and response to augmentation. Limited views of the contralateral common femoral vein are unremarkable. OTHER Mild soft tissue edema noted in the lower leg without apparent focal fluid collection. Limitations: none IMPRESSION: No evidence of acute left lower extremity DVT. Electronically Signed   By: Richardean Sale M.D.   On: 08/17/2021 09:03   DG Chest Portable 1 View  Result Date: 08/17/2021 CLINICAL DATA:  Hypoxia. EXAM: PORTABLE CHEST 1 VIEW COMPARISON:  None. FINDINGS: Bilateral lower lung field interstitial prominence may represent atelectasis or scarring. Developing infiltrate is less likely but not excluded clinical correlation is recommended. No focal consolidation, pleural effusion, or pneumothorax. Mild cardiomegaly. Atherosclerotic calcification of the aorta. No acute osseous pathology. IMPRESSION: 1. Bibasilar atelectasis or scarring. No focal consolidation. 2. Mild cardiomegaly. Electronically Signed   By: Anner Crete M.D.   On: 08/17/2021  03:19   ECHOCARDIOGRAM COMPLETE  Result Date: 08/17/2021    ECHOCARDIOGRAM REPORT   Patient Name:   James Mata [facility] Date of Exam: 08/17/2021 Medical Rec #:  [id number]       Height:       67.0 in Accession #:    [id number]      Weight:       154.0 lb Date of Birth:  October 06, 1930       BSA:          1.810 m Patient Age:    88 years        BP:  120/70 mmHg Patient Gender: M               HR:           71 bpm. Exam Location:  [facility] Procedure: 2D Echo, Cardiac Doppler and Color Doppler Indications:     CHF-acute diastolic [id number]  History:         Patient has no prior history of Echocardiogram examinations.                  CHF; Risk Factors:Dyslipidemia and Hypertension.  Sonographer:     Sherrie Sport Referring Phys:  [id number] AGBATA Diagnosing Phys: Yolonda Kida MD  Sonographer Comments: Suboptimal apical window. IMPRESSIONS  1. Left ventricular ejection fraction, by estimation, is 50 to 55%. The left ventricle has low normal function. The left ventricle has no regional wall motion abnormalities. Left ventricular diastolic parameters are consistent with Grade II diastolic dysfunction (pseudonormalization).  2. Right ventricular systolic function is low normal. The right ventricular size is mildly enlarged. Mildly increased right ventricular wall thickness.  3. The mitral valve is normal in structure. Trivial mitral valve regurgitation.  4. The aortic valve is grossly normal. Aortic valve regurgitation is not visualized. FINDINGS  Left Ventricle: Left ventricular ejection fraction, by estimation, is 50 to 55%. The left ventricle has low normal function. The left ventricle has no regional wall motion abnormalities. The left ventricular internal cavity size was normal in size. There is no left ventricular hypertrophy. Left ventricular diastolic parameters are consistent with Grade II diastolic dysfunction (pseudonormalization). Right Ventricle: The right ventricular size is mildly enlarged. Mildly  increased right ventricular wall thickness. Right ventricular systolic function is low normal. Left Atrium: Left atrial size was normal in size. Right Atrium: Right atrial size was normal in size. Pericardium: There is no evidence of pericardial effusion. Mitral Valve: The mitral valve is normal in structure. Trivial mitral valve regurgitation. MV peak gradient, 4.5 mmHg. The mean mitral valve gradient is 2.0 mmHg. Tricuspid Valve: The tricuspid valve is normal in structure. Tricuspid valve regurgitation is mild. Aortic Valve: The aortic valve is grossly normal. Aortic valve regurgitation is not visualized. Aortic valve mean gradient measures 2.0 mmHg. Aortic valve peak gradient measures 3.7 mmHg. Aortic valve area, by VTI measures 3.63 cm. Pulmonic Valve: The pulmonic valve was normal in structure. Pulmonic valve regurgitation is not visualized. Aorta: The ascending aorta was not well visualized. IAS/Shunts: No atrial level shunt detected by color flow Doppler.  LEFT VENTRICLE PLAX 2D LVIDd:         4.30 cm   Diastology LVIDs:         3.20 cm   LV e' medial:    5.87 cm/s LV PW:         1.90 cm   LV E/e' medial:  15.2 LV IVS:        1.40 cm   LV e' lateral:   11.00 cm/s LVOT diam:     2.10 cm   LV E/e' lateral: 8.1 LV SV:         53 LV SV Index:   29 LVOT Area:     3.46 cm  RIGHT VENTRICLE RV S prime:     11.70 cm/s TAPSE (M-mode): 1.3 cm LEFT ATRIUM              Index        RIGHT ATRIUM           Index LA diam:  5.00 cm  2.76 cm/m   RA Area:     24.10 cm LA Vol (A2C):   74.7 ml  41.28 ml/m  RA Volume:   62.30 ml  34.43 ml/m LA Vol (A4C):   120.0 ml 66.31 ml/m LA Biplane Vol: 105.0 ml 58.02 ml/m  AORTIC VALVE                    PULMONIC VALVE AV Area (Vmax):    3.08 cm     PV Vmax:        1.43 m/s AV Area (Vmean):   3.39 cm     PV Vmean:       89.100 cm/s AV Area (VTI):     3.63 cm     PV VTI:         0.230 m AV Vmax:           96.10 cm/s   PV Peak grad:   8.2 mmHg AV Vmean:          58.900 cm/s   PV Mean grad:   4.0 mmHg AV VTI:            0.146 m      RVOT Peak grad: 3 mmHg AV Peak Grad:      3.7 mmHg AV Mean Grad:      2.0 mmHg LVOT Vmax:         85.50 cm/s LVOT Vmean:        57.600 cm/s LVOT VTI:          0.153 m LVOT/AV VTI ratio: 1.05  AORTA Ao Root diam: 3.67 cm MITRAL VALVE               TRICUSPID VALVE MV Area (PHT): 3.79 cm    TR Peak grad:   46.8 mmHg MV Area VTI:   2.30 cm    TR Vmax:        342.00 cm/s MV Peak grad:  4.5 mmHg MV Mean grad:  2.0 mmHg    SHUNTS MV Vmax:       1.06 m/s    Systemic VTI:  0.15 m MV Vmean:      73.1 cm/s   Systemic Diam: 2.10 cm MV Decel Time: 200 msec    Pulmonic VTI:  0.128 m MV E velocity: 89.10 cm/s MV A velocity: 51.80 cm/s MV E/A ratio:  1.72 Dwayne D Callwood MD Electronically signed by Yolonda Kida MD Signature Date/Time: 08/17/2021/1:48:23 PM    Final      Assessment: 88 year old male with a history of BPH and urinary retention managed with indwelling Foley who presented to the ED for gross hematuria and a clotted off indwelling Foley also found to be in acute CHF who is currently admitted for further investigation into his acute CHF and management of his gross hematuria with CBI.  Attempts to titrate off CBI by ED staff throughout the night were unsuccessful.  I irrigated the Foley catheter with 500 cc of saline solution until urine had no color and I did not retrieve any clots.  I then reconnected the CBI and placed him on a slow titration.  When I checked on this afternoon, CBI was off and urine was red clear.  I irrigated with 1.5 mL of sterile water until clear.  No clots were obtained.  I then reconnected the CBI to a slow drip and after a few minutes of observation it remained clear.  I have also put  an extra 20 cc in the balloon and put the Foley catheter on traction with a STAT lock.  This seems to assist on clearing the urine of hematuria, so the hematuria is most likely prostatic in nature.  We will continue CBI trying to titrate the  rate to a slow drip and while Foley remains on traction.     Plan: -Hand irrigate as needed -Keep catheter on traction -Continue to maintain a slow drip CBI if possible -Continue to trend H&H -We will reassess in the morning and if no improvement in hematuria we may consider HoLEP on Friday   LOS: 1 day    [facility] 08/18/2021

## 2021-08-18 NOTE — TOC Initial Note (Signed)
Transition of Care [facility]) - Initial/Assessment Note    Patient Details  Name: James Mata MRN: [id number] Date of Birth: 12-07-1930  [facility] CM/SW Contact:    James Hutching, RN Phone Number: 08/18/2021, 10:55 AM  Clinical Narrative:                 Patient admitted to the hospital for CHF exacerbation on acute O2 at 4L [name].  RNCM met with patient at the bedside and spoke with patient's daughter, James Mata, via phone.  Patient is very hard of hearing.  He lives with his daughter and son in law, he can walk short distances with a walker, longer distances he uses a transport chair, he has a chronic foley.  Daughter reports that he can dress himself, shower himself, he likes to play cards every day with her husband and read the paper.  Daughter provides transportation.  Patient is currently open with [facility] for RN and PT, he wants to go home.  Did discuss the possibility of SNF with daughter since he is so much weaker than normal, she says he will likely not want to go but may agree if presented in the right way, she does not want to put him a nursing home long term.  TOC will follow and assist with discharge planning.  Patient is being followed by urology on continuous bladder irrigation.    Expected Discharge Plan: Skilled Nursing Facility Barriers to Discharge: Continued Medical Work up   Patient Goals and CMS Choice Patient states their goals for this hospitalization and ongoing recovery are:: Patient wants to go home CMS Medicare.gov Compare Post Acute Care list provided to:: Patient Choice offered to / list presented to : Patient, Adult Children  Expected Discharge Plan and Services Expected Discharge Plan: [place]   Discharge Planning Services: CM Consult   Living arrangements for the past 2 months: Single Family Home                 DME Arranged: N/A DME Agency: NA         HH Agency: [place] Date [place]:  08/18/21 Time [place]: 1054 Representative spoke with at [place]: James Mata  Prior Living Arrangements/Services Living arrangements for the past 2 months: [place] with:: Adult Children Patient language and need for interpreter reviewed:: Yes Do you feel safe going back to the place where you live?: Yes      Need for Family Participation in Patient Care: Yes (Comment) (CHF) Care giver support system in place?: Yes (comment) (daughter and son in Sports coach) Current home services: DME, Home RN, Home PT (Walker, transport chair) Criminal Activity/Legal Involvement Pertinent to Current Situation/Hospitalization: No - Comment as needed  Activities of Daily Living      Permission Sought/Granted Permission sought to share information with : Case Manager, Family Supports Permission granted to share information with : Yes, Verbal Permission Granted  Share Information with NAME: James Mata     Permission granted to share info w Relationship: daughter  Permission granted to share info w Contact Information: [phone]  Emotional Assessment Appearance:: Appears stated age Attitude/Demeanor/Rapport: Engaged Affect (typically observed): Accepting Orientation: : Oriented to Self, Oriented to Place Alcohol / Substance Use: Not Applicable Psych Involvement: No (comment)  Admission diagnosis:  Acute exacerbation of CHF (congestive heart failure) (HCC) [I50.9] Patient Active Problem List   Diagnosis Date Noted   Acute exacerbation of CHF (congestive heart failure) ([place])  08/17/2021   Hematuria, gross 08/17/2021   Hypertension    Unspecified atrial fibrillation ([place])    Increased frequency of urination 07/12/2013   BPH with obstruction/lower urinary tract symptoms 05/02/2013   PCP:  James Simmer, MD Pharmacy:   [id number] [place], [place] [id number]: [phone]: [phone] [id number]Phone: [phone]: [phone]    Social Determinants of Health (SDOH) Interventions    Readmission Risk Interventions No flowsheet data found.

## 2021-08-18 NOTE — Progress Notes (Signed)
°  Progress Note   Patient: James Mata [id number] DOB: 18-May-1931 DOA: 08/17/2021     1 DOS: the patient was seen and examined on 08/18/2021   Brief hospital course: 88 year old male admitted to the hospital for evaluation of gross hematuria and acute CHF  Assessment and Plan * Acute exacerbation of CHF (congestive heart failure) ([place]) POA, pt had  significant lower extremity swelling and JVD on admission.   No prior echocardiogram on file. BNP 213.4 --Continue Lasix 20 mg IV BID --Continue metoprolol and nitrates --Follow up pending echo --I/O's and daily wts --Monitor renal function and electrolytes  Hematuria, gross- (present on admission) Likely due to urethral trauma with Foley exchange day before presenting. --Mgmt per urology  BPH with obstruction/lower urinary tract symptoms- (present on admission) Pt has chronic indwelling Foley. Having obstructive symptoms and was recently exchanged 1 day prior to this admission.  Presented to the ER with obstructive urinary symptoms and gross hematuria --Urology following, see their recs --Continue CBI  --Continue finasteride, Enablex and Cardura --Hold Aspirin  Unspecified atrial fibrillation ([place])- (present on admission) Rate controlled Continue metoprolol CHA2DS2-VASc of 4 and ideally will require long-term anticoagulation, hold off on anticoagulation for now due to gross hematuria Follow-up pending echo  Hypertension- (present on admission) BP overall stable. --Continue metoprolol  CAD (coronary artery disease)- (present on admission) Continue nitrates and beta-blockers Aspirin is on hold due to gross hematuria     Subjective: Pt seen in ED holding for a bed, daughter at bedside.  Pt reports wanting to go home.  He perseverates on going home today.  Does not want to talk about his need for oxygen and says that's not why he came here.  Unable to elicit any specific acute complaints other than pt wants to go  home.  Objective Vitals reviewed, notable for intermittent soft BP 107/70 this AM, no fevers, o2 sats 01-95% on 4 L/min [place] O2.  General exam: awake, alert, no acute distress HEENT: nasal cannula in place, moist mucus membranes, hearing grossly normal  Respiratory system: CTAB diminished bases, no wheezes, rales or rhonchi, normal respiratory effort. Cardiovascular system: normal S1/S2, RRR, BLE edema.   Geniturinary system: Foley in place with CBI running. Central nervous system:  no gross focal neurologic deficits, normal speech Extremities: moves all , no edema, normal tone Skin: dry, intact, normal temperature Psychiatry: normal mood, congruent affect, judgement and insight abnormal   Data Reviewed:  Labs notable for K 3.4, glucose 112, Ca 8.5, BNP 213.4, Troponin 11>>14, WBC 10.7, Hbg 11 (from 13.1), Plt 137k  Family Communication: daughter and another family member at bedside on rounds.  Disposition: Status is: Inpatient  Remains inpatient appropriate because: severity of illness with CBI running, on IV therapies, ongoing evaluation, requiring oxygen         Time spent: 35 minutes  Author: Ezekiel Slocumb, DO 08/18/2021 4:18 PM  For on call review [email or web address].

## 2021-08-18 NOTE — Assessment & Plan Note (Addendum)
POA, pt had  significant lower extremity swelling and JVD on admission.   No prior echocardiogram on file. BNP 213.4 Echo on 1/25 showed EF 50 to [id number], grade 2 diastolic dysfunction, no LV regional wall motion abnormalities Current home dose Lasix 80 mg twice daily. 1/28: Weaning down oxygen, this morning on 1 L/min  Net IO Since Admission: -2,037 mL [08/21/21 1701] (I/O's charted appear inaccurate) Weight is down over 6 kg if accurate. Status post IV Lasix, initially 20>> 40 mg IV twice daily --Transition Lasix to home p.o. 80 mg twice daily --Continue metoprolol and nitrates --I/O's and daily wts --Monitor renal function and electrolytes  1/29: appears euvolemic. BNP 183. Admission BNP 213. On po lasix. Will try and wean off supplemental O2. Pt lives with dtr and son-in-law.

## 2021-08-18 NOTE — ED Notes (Signed)
Urology PA at bedside, preparing to manually irrigate bladder

## 2021-08-18 NOTE — ED Notes (Signed)
CBI continued throughout the night with output remaining bloody to pink tinged. No clots noted.

## 2021-08-18 NOTE — Assessment & Plan Note (Signed)
BP overall stable. --Continue metoprolol

## 2021-08-18 NOTE — Hospital Course (Signed)
88 year old male admitted to the hospital for evaluation of gross hematuria and acute CHF

## 2021-08-18 NOTE — Assessment & Plan Note (Addendum)
Resolved.  Likely due to urethral trauma with Foley exchange day before presenting. --Mgmt per urology  1/29: resolved

## 2021-08-18 NOTE — Assessment & Plan Note (Addendum)
Rate controlled Continue metoprolol CHA2DS2-VASc of 4 and ideally will require long-term anticoagulation. Hold off on anticoagulation for now due to gross hematuria.  Consider resuming tomorrow if still no bleeding.  1/29: can restart ASA at discharge.

## 2021-08-19 DIAGNOSIS — N401 Enlarged prostate with lower urinary tract symptoms: Secondary | ICD-10-CM | POA: Diagnosis not present

## 2021-08-19 DIAGNOSIS — N3289 Other specified disorders of bladder: Secondary | ICD-10-CM | POA: Diagnosis not present

## 2021-08-19 DIAGNOSIS — R310 Gross hematuria: Secondary | ICD-10-CM | POA: Diagnosis not present

## 2021-08-19 DIAGNOSIS — N138 Other obstructive and reflux uropathy: Secondary | ICD-10-CM | POA: Diagnosis not present

## 2021-08-19 DIAGNOSIS — T839XXA Unspecified complication of genitourinary prosthetic device, implant and graft, initial encounter: Secondary | ICD-10-CM | POA: Diagnosis not present

## 2021-08-19 DIAGNOSIS — I509 Heart failure, unspecified: Secondary | ICD-10-CM | POA: Diagnosis not present

## 2021-08-19 DIAGNOSIS — R339 Retention of urine, unspecified: Secondary | ICD-10-CM | POA: Diagnosis not present

## 2021-08-19 LAB — MAGNESIUM: Magnesium: 2.4 mg/dL (ref 1.7–2.4)

## 2021-08-19 LAB — BASIC METABOLIC PANEL
Anion gap: 7 (ref 5–15)
BUN: 19 mg/dL (ref 8–23)
CO2: 26 mmol/L (ref 22–32)
Calcium: 8.3 mg/dL — ABNORMAL LOW (ref 8.9–10.3)
Chloride: 104 mmol/L (ref 98–111)
Creatinine, Ser: 1.24 mg/dL (ref 0.61–1.24)
GFR, Estimated: 56 mL/min — ABNORMAL LOW (ref >60)
Glucose, Bld: 108 mg/dL — ABNORMAL HIGH (ref 70–99)
Potassium: 3.7 mmol/L (ref 3.5–5.1)
Sodium: 137 mmol/L (ref 135–145)

## 2021-08-19 LAB — CBC
HCT: 30.5 % — ABNORMAL LOW (ref 39.0–52.0)
Hemoglobin: 10.4 g/dL — ABNORMAL LOW (ref 13.0–17.0)
MCH: 33.2 pg (ref 26.0–34.0)
MCHC: 34.1 g/dL (ref 30.0–36.0)
MCV: 97.4 fL (ref 80.0–100.0)
Platelets: 128 10*3/uL — ABNORMAL LOW (ref 150–400)
RBC: 3.13 MIL/uL — ABNORMAL LOW (ref 4.22–5.81)
RDW: 14.6 % (ref 11.5–15.5)
WBC: 10.3 10*3/uL (ref 4.0–10.5)
nRBC: 0 % (ref 0.0–0.2)

## 2021-08-19 MED ORDER — BISACODYL 5 MG PO TBEC
5.0000 mg | DELAYED_RELEASE_TABLET | Freq: Every day | ORAL | Status: DC | PRN
  Administered 2021-08-19 – 2021-08-20 (×2): 5 mg via ORAL
  Filled 2021-08-19 (×2): qty 1

## 2021-08-19 MED ORDER — SENNOSIDES-DOCUSATE SODIUM 8.6-50 MG PO TABS
1.0000 | ORAL_TABLET | Freq: Two times a day (BID) | ORAL | Status: DC
  Administered 2021-08-19 – 2021-08-22 (×6): 1 via ORAL
  Filled 2021-08-19 (×6): qty 1

## 2021-08-19 MED ORDER — POLYETHYLENE GLYCOL 3350 17 G PO PACK
17.0000 g | PACK | Freq: Every day | ORAL | Status: DC
  Administered 2021-08-19 – 2021-08-22 (×4): 17 g via ORAL
  Filled 2021-08-19 (×4): qty 1

## 2021-08-19 NOTE — Progress Notes (Signed)
°  Progress Note   Patient: James Mata [id number] DOB: [birth year] DOA: 08/17/2021     2 DOS: the patient was seen and examined on 08/19/2021   Brief hospital course: 88 year old male admitted to the hospital for evaluation of gross hematuria and acute CHF  Assessment and Plan * Acute exacerbation of CHF (congestive heart failure) ([place]) POA, pt had  significant lower extremity swelling and JVD on admission.   No prior echocardiogram on file. BNP 213.4 Echo on 1/25 showed EF 50 to [id number], grade 2 diastolic dysfunction, no LV regional wall motion abnormalities 1/26: Oxygen weaned from 4 down to 2.5 L/min, renal function stable --Continue Lasix 20 mg IV BID --Continue metoprolol and nitrates --I/O's and daily wts --Monitor renal function and electrolytes  Hematuria, gross- (present on admission) Likely due to urethral trauma with Foley exchange day before presenting. --Mgmt per urology  BPH with obstruction/lower urinary tract symptoms- (present on admission) Pt has chronic indwelling Foley. Having obstructive symptoms after Foley was exchanged 1 day prior to this admission.  Presented to the ER with obstructive urinary symptoms and gross hematuria --Urology following, see their recs --CBI currently off, per urology --Continue finasteride, Enablex and Cardura --Hold Aspirin -- If bleeding recurs, make patient n.p.o. for procedure tomorrow  Unspecified atrial fibrillation ([place])- (present on admission) Rate controlled Continue metoprolol CHA2DS2-VASc of 4 and ideally will require long-term anticoagulation, hold off on anticoagulation for now due to gross hematuria Follow-up pending echo  Hypertension- (present on admission) BP overall stable. --Continue metoprolol  CAD (coronary artery disease)- (present on admission) Continue nitrates and beta-blockers Aspirin is on hold due to gross hematuria     Subjective: Patient up in recliner with daughter and son-in-law at bedside  when seen today.  He reported doing well with PT and walked about 200 feet.  States that PT was surprised.  He denies any chest pain, shortness of breath or fevers chills, nausea vomiting.  Denies any acute complaints.  He is in better spirits and more cooperative today.  Objective Vitals reviewed and notable for soft blood pressures 108/69, 109/66, 98/52.  Oxygen requirement weaned from 4 L/min down to 2.5 L/min.  No fevers.  General exam: awake, alert, no acute distress, up in recliner HEENT: atraumatic, clear conjunctiva, anicteric sclera, moist mucus membranes, hearing grossly normal  Respiratory system: CTAB with diminished bases, no wheezes, rales or rhonchi, normal respiratory effort. Cardiovascular system: normal S1/S2, RRR, JVP noted, mildly improved bilateral lower extremity edema.   Genitourinary system: Foley catheter in place, CBI is off, dark reddish clear urine in the bag Central nervous system: A&O x3. no gross focal neurologic deficits, normal speech Extremities: moves all, normal tone Skin: dry, intact, normal temperature Psychiatry: normal mood, congruent affect, judgement and insight appear normal   Data Reviewed:  Labs reviewed and notable for glucose 108, calcium 8.3, hemoglobin 10.4 (down from 11.0 yesterday) platelets 128 down slightly from 137 yesterday.  Echocardiogram with EF 50 to [id number], grade 2 diastolic dysfunction without regional wall abnormalities  Family Communication: Daughter and son-in-law at bedside on rounds today  Disposition: Status is: Inpatient  Remains inpatient appropriate because: Severity of illness requiring IV medications as outlined above.  Discharge pending clearance by urology once hematuria has resolved, may require procedure depending on clinical course         Time spent: 35 minutes  Author: Ezekiel Slocumb, DO 08/19/2021 2:42 PM  For on call review [email or web address].

## 2021-08-19 NOTE — Progress Notes (Addendum)
OT Cancellation Note  Patient Details Name: James Mata MRN: [id number] DOB: 1930/09/16   Cancelled Treatment:    Reason Eval/Treat Not Completed: Other (comment). Consult received, chart reviewed. Upon attempt, pt with nursing staff who report pt just received his breakfast. Will re-attempt OT evaluation at later time.   Addendum, 11:30am: 2nd attempt pt with nursing staff for care. 3rd attempt, pt with MD. Will continue to re-attempt as able.  Arman Filter., MPH, MS, OTR/L ascom [phone] 08/19/21, 8:46 AM

## 2021-08-19 NOTE — Progress Notes (Signed)
Urology Consult Follow Up  Subjective: Patient up in bed eating.  He states he is ready to go home.  Vital signs stable afebrile  Morning labs: hemoglobin is 10.4 down from 11.0 yesterday and hematocrit is 30.5% down from 32.1% from yesterday.  Serum creatinine remains stable at 1.24.  His WBC count is 10.3.  Foley catheter remains on traction and patient is draining yellow urine with some old clot debris with CBI on slow titration.  Anti-infectives: Anti-infectives (From admission, onward)    None       Current Facility-Administered Medications  Medication Dose Route Frequency Provider Last Rate Last Admin   0.9 %  sodium chloride infusion  250 mL Intravenous PRN Agbata, Tochukwu, MD       bisacodyl (DULCOLAX) EC tablet 5 mg  5 mg Oral Daily PRN Nicole Kindred A, DO       Chlorhexidine Gluconate Cloth 2 % PADS 6 each  6 each Topical Daily Athena Masse, MD   6 each at 08/18/21 1724   darifenacin (ENABLEX) 24 hr tablet 7.5 mg  7.5 mg Oral Daily Agbata, Tochukwu, MD   7.5 mg at 08/18/21 1807   doxazosin (CARDURA) tablet 4 mg  4 mg Oral QHS Agbata, Tochukwu, MD   4 mg at 08/18/21 2148   finasteride (PROSCAR) tablet 5 mg  5 mg Oral Daily Agbata, Tochukwu, MD   5 mg at 08/18/21 1725   fluticasone (FLONASE) 50 MCG/ACT nasal spray 1 spray  1 spray Each Nare Daily PRN Agbata, Tochukwu, MD       furosemide (LASIX) injection 20 mg  20 mg Intravenous Q12H Agbata, Tochukwu, MD   20 mg at 08/19/21 0620   isosorbide mononitrate (IMDUR) 24 hr tablet 30 mg  30 mg Oral Daily Agbata, Tochukwu, MD   30 mg at 08/18/21 1143   metoprolol succinate (TOPROL-XL) 24 hr tablet 12.5 mg  12.5 mg Oral Daily Agbata, Tochukwu, MD   12.5 mg at 08/18/21 1724   multivitamin with minerals tablet 1 tablet  1 tablet Oral Daily Agbata, Tochukwu, MD   1 tablet at 08/18/21 1142   nitroGLYCERIN (NITROSTAT) SL tablet 0.4 mg  0.4 mg Sublingual Q5 min PRN Agbata, Tochukwu, MD       ondansetron (ZOFRAN) tablet 4 mg  4 mg  Oral Q6H PRN Agbata, Tochukwu, MD       Or   ondansetron (ZOFRAN) injection 4 mg  4 mg Intravenous Q6H PRN Agbata, Tochukwu, MD       polyethylene glycol (MIRALAX / GLYCOLAX) packet 17 g  17 g Oral Daily Nicole Kindred A, DO       potassium chloride SA (KLOR-CON M) CR tablet 40 mEq  40 mEq Oral Daily Agbata, Tochukwu, MD   40 mEq at 08/18/21 1143   senna-docusate (Senokot-S) tablet 1 tablet  1 tablet Oral BID Nicole Kindred A, DO       sodium chloride flush (NS) 0.9 % injection 3 mL  3 mL Intravenous Q12H Agbata, Tochukwu, MD   3 mL at 08/18/21 2150   sodium chloride flush (NS) 0.9 % injection 3 mL  3 mL Intravenous PRN Agbata, Tochukwu, MD         Objective: Vital signs in last 24 hours: Temp:  [97.9 F (36.6 C)-99.1 F (37.3 C)] 97.9 F (36.6 C) (01/26 0728) Pulse Rate:  [55-73] 72 (01/26 0728) Resp:  [15-18] 18 (01/26 0728) BP: (97-145)/(49-75) 109/66 (01/26 0728) SpO2:  [92 %-98 %] 98 % (01/26 0728)  Weight:  [69.7 kg] 69.7 kg (01/26 0600)  Intake/Output from previous day: 01/25 0701 - 01/26 0700 In: 6300  Out: 7000 [Urine:7000] Intake/Output this shift: Total I/O In: 480 [P.O.:480] Out: 1100 [Urine:1100]   Physical Exam Constitutional:  Well nourished. Alert and oriented, No acute distress. HEENT: [place] AT, moist mucus membranes.  Trachea midline Cardiovascular: No clubbing, cyanosis, or edema. Respiratory: Normal respiratory effort, no increased work of breathing. Neurologic: Grossly intact, no focal deficits, moving all 4 extremities. Psychiatric: Normal mood and affect.   Lab Results:  Recent Labs    08/18/21 0728 08/19/21 0637  WBC 10.7* 10.3  HGB 11.0* 10.4*  HCT 32.1* 30.5*  PLT 137* 128*   BMET Recent Labs    08/18/21 0728 08/19/21 0637  NA 137 137  K 3.4* 3.7  CL 104 104  CO2 25 26  GLUCOSE 112* 108*  BUN 19 19  CREATININE 1.21 1.24  CALCIUM 8.5* 8.3*   PT/INR No results for input(s): LABPROT, INR in the last 72 hours. ABG No results for  input(s): PHART, HCO3 in the last 72 hours.  Invalid input(s): PCO2, PO2  Studies/Results: CT CHEST WO CONTRAST  Result Date: 08/17/2021 CLINICAL DATA:  Pneumonia, complications suspected, x-ray done. EXAM: CT CHEST WITHOUT CONTRAST TECHNIQUE: Multidetector CT imaging of the chest was performed following the standard protocol without IV contrast. RADIATION DOSE REDUCTION: This exam was performed according to the departmental dose-optimization program which includes automated exposure control, adjustment of the mA and/or kV according to patient size and/or use of iterative reconstruction technique. COMPARISON:  Chest radiograph 08/17/2021 FINDINGS: Cardiovascular: Ascending thoracic aorta measures up to 3.8 cm. Descending thoracic aorta measures 3.3 cm. Atherosclerotic calcifications in the thoracic aorta. Heart size is enlarged with small to moderate amount of pericardial fluid. Diffuse coronary artery calcifications particularly in the LAD. Mediastinum/Nodes: Difficult to exclude supraclavicular lymph nodes versus enlarged venous structures particularly on the right side. Thyroid tissue appears to be heterogeneous. Subcarinal tissue measures 1.1 cm in the short axis on sequence 2, image 64. Limited evaluation for hilar lymphadenopathy without intravascular contrast. No significant axillary lymph node enlargement. Lungs/Pleura: Trachea and mainstem bronchi are patent. Dependent densities in the lower lobes bilaterally. No significant pleural fluid. No significant airspace disease or consolidation in the lungs. 3 mm pleural-based calcification in the posterior left upper lobe on sequence 3 image 27. Upper Abdomen: Numerous calcified stones in the gallbladder. The gallbladder appears to be completely stone filled. No significant gallbladder inflammatory changes but the gallbladder is not completely imaged. Large low-density structure in the right renal hilum could represent a large parapelvic cyst measuring up  to 4.1 cm but the kidneys are incompletely evaluated. Calcifications and fullness in the left adrenal gland is nonspecific Musculoskeletal: No acute bone abnormality. Disc space narrowing at C7-T1. IMPRESSION: 1. No acute abnormality in the chest. Dependent densities in both lower lobes are most compatible with atelectasis. No evidence for pneumonia. 2. Cardiomegaly with small to moderate amount of pericardial fluid. 3. Aortic Atherosclerosis (ICD10-I70.0). Coronary artery calcifications 4. Cholelithiasis. 5. Probable large right parapelvic renal cyst but incompletely evaluated. Electronically Signed   By: Markus Daft M.D.   On: 08/17/2021 09:27   US Venous Img Lower Unilateral Left (DVT)  Result Date: 08/17/2021 CLINICAL DATA:  Bilateral lower extremity swelling, left greater than right. Clinical concern for DVT. EXAM: LEFT LOWER EXTREMITY VENOUS DOPPLER ULTRASOUND TECHNIQUE: Gray-scale sonography with compression, as well as color and duplex ultrasound, were performed to evaluate the deep venous  system(s) from the level of the common femoral vein through the popliteal and proximal calf veins. COMPARISON:  None. FINDINGS: VENOUS Normal compressibility of the common femoral, superficial femoral, and popliteal veins, as well as the visualized calf veins. Visualized portions of profunda femoral vein and great saphenous vein unremarkable. No filling defects to suggest DVT on grayscale or color Doppler imaging. Doppler waveforms show normal direction of venous flow, normal respiratory plasticity and response to augmentation. Limited views of the contralateral common femoral vein are unremarkable. OTHER Mild soft tissue edema noted in the lower leg without apparent focal fluid collection. Limitations: none IMPRESSION: No evidence of acute left lower extremity DVT. Electronically Signed   By: Richardean Sale M.D.   On: 08/17/2021 09:03   ECHOCARDIOGRAM COMPLETE  Result Date: 08/17/2021    ECHOCARDIOGRAM REPORT    Patient Name:   James Mata [facility] Date of Exam: 08/17/2021 Medical Rec #:  [id number]       Height:       67.0 in Accession #:    [id number]      Weight:       154.0 lb Date of Birth:  Dec 21, 1930       BSA:          1.810 m Patient Age:    63 years        BP:           120/70 mmHg Patient Gender: M               HR:           71 bpm. Exam Location:  [facility] Procedure: 2D Echo, Cardiac Doppler and Color Doppler Indications:     CHF-acute diastolic [id number]  History:         Patient has no prior history of Echocardiogram examinations.                  CHF; Risk Factors:Dyslipidemia and Hypertension.  Sonographer:     Sherrie Sport Referring Phys:  [id number] AGBATA Diagnosing Phys: Yolonda Kida MD  Sonographer Comments: Suboptimal apical window. IMPRESSIONS  1. Left ventricular ejection fraction, by estimation, is 50 to 55%. The left ventricle has low normal function. The left ventricle has no regional wall motion abnormalities. Left ventricular diastolic parameters are consistent with Grade II diastolic dysfunction (pseudonormalization).  2. Right ventricular systolic function is low normal. The right ventricular size is mildly enlarged. Mildly increased right ventricular wall thickness.  3. The mitral valve is normal in structure. Trivial mitral valve regurgitation.  4. The aortic valve is grossly normal. Aortic valve regurgitation is not visualized. FINDINGS  Left Ventricle: Left ventricular ejection fraction, by estimation, is 50 to 55%. The left ventricle has low normal function. The left ventricle has no regional wall motion abnormalities. The left ventricular internal cavity size was normal in size. There is no left ventricular hypertrophy. Left ventricular diastolic parameters are consistent with Grade II diastolic dysfunction (pseudonormalization). Right Ventricle: The right ventricular size is mildly enlarged. Mildly increased right ventricular wall thickness. Right ventricular systolic function is low normal.  Left Atrium: Left atrial size was normal in size. Right Atrium: Right atrial size was normal in size. Pericardium: There is no evidence of pericardial effusion. Mitral Valve: The mitral valve is normal in structure. Trivial mitral valve regurgitation. MV peak gradient, 4.5 mmHg. The mean mitral valve gradient is 2.0 mmHg. Tricuspid Valve: The tricuspid valve is normal in structure. Tricuspid valve regurgitation is mild. Aortic Valve: The  aortic valve is grossly normal. Aortic valve regurgitation is not visualized. Aortic valve mean gradient measures 2.0 mmHg. Aortic valve peak gradient measures 3.7 mmHg. Aortic valve area, by VTI measures 3.63 cm. Pulmonic Valve: The pulmonic valve was normal in structure. Pulmonic valve regurgitation is not visualized. Aorta: The ascending aorta was not well visualized. IAS/Shunts: No atrial level shunt detected by color flow Doppler.  LEFT VENTRICLE PLAX 2D LVIDd:         4.30 cm   Diastology LVIDs:         3.20 cm   LV e' medial:    5.87 cm/s LV PW:         1.90 cm   LV E/e' medial:  15.2 LV IVS:        1.40 cm   LV e' lateral:   11.00 cm/s LVOT diam:     2.10 cm   LV E/e' lateral: 8.1 LV SV:         53 LV SV Index:   29 LVOT Area:     3.46 cm  RIGHT VENTRICLE RV S prime:     11.70 cm/s TAPSE (M-mode): 1.3 cm LEFT ATRIUM              Index        RIGHT ATRIUM           Index LA diam:        5.00 cm  2.76 cm/m   RA Area:     24.10 cm LA Vol (A2C):   74.7 ml  41.28 ml/m  RA Volume:   62.30 ml  34.43 ml/m LA Vol (A4C):   120.0 ml 66.31 ml/m LA Biplane Vol: 105.0 ml 58.02 ml/m  AORTIC VALVE                    PULMONIC VALVE AV Area (Vmax):    3.08 cm     PV Vmax:        1.43 m/s AV Area (Vmean):   3.39 cm     PV Vmean:       89.100 cm/s AV Area (VTI):     3.63 cm     PV VTI:         0.230 m AV Vmax:           96.10 cm/s   PV Peak grad:   8.2 mmHg AV Vmean:          58.900 cm/s  PV Mean grad:   4.0 mmHg AV VTI:            0.146 m      RVOT Peak grad: 3 mmHg AV Peak Grad:       3.7 mmHg AV Mean Grad:      2.0 mmHg LVOT Vmax:         85.50 cm/s LVOT Vmean:        57.600 cm/s LVOT VTI:          0.153 m LVOT/AV VTI ratio: 1.05  AORTA Ao Root diam: 3.67 cm MITRAL VALVE               TRICUSPID VALVE MV Area (PHT): 3.79 cm    TR Peak grad:   46.8 mmHg MV Area VTI:   2.30 cm    TR Vmax:        342.00 cm/s MV Peak grad:  4.5 mmHg MV Mean grad:  2.0 mmHg    SHUNTS MV Vmax:  1.06 m/s    Systemic VTI:  0.15 m MV Vmean:      73.1 cm/s   Systemic Diam: 2.10 cm MV Decel Time: 200 msec    Pulmonic VTI:  0.128 m MV E velocity: 89.10 cm/s MV A velocity: 51.80 cm/s MV E/A ratio:  1.72 Dwayne D Callwood MD Electronically signed by Yolonda Kida MD Signature Date/Time: 08/17/2021/1:48:23 PM    Final      Assessment: 88 year old male with a history of BPH and urinary retention managed with indwelling Foley who presented to the ED for gross hematuria and a clotted off indwelling Foley also found to be in acute CHF who is currently admitted for further investigation into his acute CHF and management of his gross hematuria with CBI.  Urine is now yellow with scant old clot debris on slow CBI titration.    Plan: -CBI titration is off and will reassess this afternoon -If no hematuria we presents we will likely discontinue CBI -Continue to trend H&H -If not performed during this admission, patient would certainly benefit from HoLEP in the future   LOS: 2 days    [facility] 08/19/2021

## 2021-08-19 NOTE — Evaluation (Signed)
Physical Therapy Evaluation Patient Details Name: James Mata MRN: [id number] DOB: [birth year] Today's Date: 08/19/2021  History of Present Illness  88 y.o. male with medical history significant for BPH with obstructive symptoms status post chronic indwelling Foley catheter that was recently changed, hypertension who presents to the ER via private vehicle for evaluation of abdominal pain and bloody urine.  Clinical Impression  Pt eager to show what he can do and was ultimately able to circumambulate the nurses' station.  On arrival pt was on 2.5L O2 with sats in the high 80s, however during ambulation on 3L he maintained mid 90s t/o the effort. He did need some assist with transitioning to sitting from supine, but otherwise did relatively well with mobility.       Recommendations for follow up therapy are one component of a multi-disciplinary discharge planning process, led by the attending physician.  Recommendations may be updated based on patient status, additional functional criteria and insurance authorization.  Follow Up Recommendations Home health PT    Assistance Recommended at Discharge PRN  Patient can return home with the following  A little help with bathing/dressing/bathroom;Assistance with cooking/housework;Assist for transportation    Equipment Recommendations    Recommendations for Other Services       Functional Status Assessment       Precautions / Restrictions Precautions Precautions: Fall Restrictions Weight Bearing Restrictions: No      Mobility  Bed Mobility Overal bed mobility: Needs Assistance Bed Mobility: Supine to Sit     Supine to sit: Min assist     General bed mobility comments: Pt able to get LEs to EOB and using rail start to get up, but ultimately needed min assit to fully get torso up to sitting EOB    Transfers Overall transfer level: Modified independent                 General transfer comment: Pt needed light reminders  to insure appropraite use of UEs but able to rise multiple times t/o assist    Ambulation/Gait Ambulation/Gait assistance: Supervision Gait Distance (Feet): 200 Feet Assistive device: Rolling walker (2 wheels)         General Gait Details: Pt with kyphotic, stooped posture that did improve temorarily with cuing.  He was on 3L O2 t/o the effort with sats remaining in the mid 90s.  Pt able to maintain appropraite but slow gait.  Stairs            Wheelchair Mobility    Modified Rankin (Stroke Patients Only)       Balance Overall balance assessment: Needs assistance Sitting-balance support: Bilateral upper extremity supported Sitting balance-Leahy Scale: Good     Standing balance support: Bilateral upper extremity supported Standing balance-Leahy Scale: Fair Standing balance comment: able to briefly maintain balance w/o AD, stable with walker                             Pertinent Vitals/Pain Pain Assessment Pain Assessment: 0-10 Pain Score: 1  Pain Location: penial pain that is much better than on arrival    [place] expects to be discharged to:: Private residence Living Arrangements: Children Available Help at Discharge: Family             Home Equipment: Conservation officer, nature (2 wheels)      Prior Function Prior Level of Function : Needs assist  Mobility Comments: Pt reports he has been working with [facility] for 4 weeks, increasing gait distance to >100 and doing a BID exercise routine ADLs Comments: daughter can help but appears he is nearly independent with dressing, bathing     Hand Dominance        Extremity/Trunk Assessment   Upper Extremity Assessment Upper Extremity Assessment: Generalized weakness (age appropraite defecits)    Lower Extremity Assessment Lower Extremity Assessment: Overall WFL for tasks assessed       Communication   Communication: No difficulties  Cognition Arousal/Alertness:  Awake/alert Behavior During Therapy: Restless Overall Cognitive Status: Within Functional Limits for tasks assessed                                 General Comments: Pt needing consistent cuing to stay on task, but showed good general situational awareness        General Comments      Exercises     Assessment/Plan    PT Assessment Patient needs continued PT services  PT Problem List Decreased strength;Decreased range of motion;Decreased activity tolerance;Decreased balance;Decreased mobility;Decreased knowledge of use of DME;Decreased safety awareness;Cardiopulmonary status limiting activity;Pain       PT Treatment Interventions DME instruction;Gait training;Stair training;Functional mobility training;Therapeutic activities;Therapeutic exercise;Balance training;Neuromuscular re-education;Patient/family education;Cognitive remediation    PT Goals (Current goals can be found in the Care Plan section)  Acute Rehab PT Goals Patient Stated Goal: Go home PT Goal Formulation: With patient Time For Goal Achievement: 09/02/21 Potential to Achieve Goals: Good    Frequency Min 2X/week     Co-evaluation               AM-PAC PT "6 Clicks" Mobility  Outcome Measure Help needed turning from your back to your side while in a flat bed without using bedrails?: A Little Help needed moving from lying on your back to sitting on the side of a flat bed without using bedrails?: A Little Help needed moving to and from a bed to a chair (including a wheelchair)?: A Little Help needed standing up from a chair using your arms (e.g., wheelchair or bedside chair)?: A Little Help needed to walk in hospital room?: A Little Help needed climbing 3-5 steps with a railing? : A Little 6 Click Score: 18    End of Session Equipment Utilized During Treatment: Gait belt;Oxygen (2.5-3 L) Activity Tolerance: Patient tolerated treatment well Patient left: with call bell/phone within  reach;with chair alarm set;with nursing/sitter in room Nurse Communication: Mobility status PT Visit Diagnosis: Muscle weakness (generalized) (M62.81);Difficulty in walking, not elsewhere classified (R26.2)    Time: [id number] PT Time Calculation (min) (ACUTE ONLY): 32 min   Charges:   PT Evaluation $PT Eval Low Complexity: 1 Low PT Treatments $Gait Training: 8-22 mins        Kreg Shropshire, DPT 08/19/2021, 10:51 AM

## 2021-08-19 NOTE — Progress Notes (Signed)
Checked on the status of patient's urine this afternoon.  He states that he had been working with physical therapy and had walked about 200 feet with a walker.  The urine still remains yellow clear with some scant clots with CBI off.  We will check on him again later this afternoon to see if the urine remains nonbloody.  If he should develop blood this afternoon or during the evening we will consider HoLEP tomorrow, so we will need to make him n.p.o. tonight.

## 2021-08-19 NOTE — Consult Note (Signed)
° °  Heart Failure Nurse Navigator Note  HFpEF 50 to 55%.  Grade 2 diastolic dysfunction.  Presented to the emergency room with complaints of blood in his Foley catheter.  On exam he exhibited positive JVD, lower extremity swelling, BNP 213.  Comorbidities:  Hypertension Atrial fibrillation  Labs:  Sodium 136, potassium 3, chloride 100, CO2 24, BUN 22, creatinine 1.33 Weight is 69.7 kg Blood pressure 133/60  Medications:  Cardura 4 mg daily Furosemide 20 mg IV every 12 Isosorbide mononitrate 30 mg daily Metoprolol succinate 12 and half milligrams  daily Potassium 40 mEq daily   Initial meeting with patient who is hard of hearing, his daughter and son-in-law who was at the bedside.  Daughter-in-law states that they live with the patient.  She is the one that provides the meals.  States that he was raised and [place] and is used to salty meals, meals fixed with fat back etc.  Discussed the type of heart failure that he has.  Talked about eating healthier with foods that contain 175 mg or less of sodium, he states that he can become very stubborn and threatened to fire her trying to get him to eat healthier.  Just at that rather than removing the saltshaker from the table that he obstruct some of the holes so he does not get as much salt he is putting it on food.  Discussed daily weights and symptoms and what to report.  Also suggested that they speak with the urologist as to how much fluid he should be taking in daily.  Given the living with heart failure teaching booklet along with his own magnet and information on low-sodium.  They have an appointment with the outpatient heart failure clinic on February 2 at 3 PM.  Is a 0% no-show ratio which is 0 out of 38 appointments.  Pricilla Riffle RN [facility]

## 2021-08-20 DIAGNOSIS — R339 Retention of urine, unspecified: Secondary | ICD-10-CM | POA: Diagnosis not present

## 2021-08-20 DIAGNOSIS — N401 Enlarged prostate with lower urinary tract symptoms: Secondary | ICD-10-CM | POA: Diagnosis not present

## 2021-08-20 DIAGNOSIS — I509 Heart failure, unspecified: Secondary | ICD-10-CM | POA: Diagnosis not present

## 2021-08-20 DIAGNOSIS — R310 Gross hematuria: Secondary | ICD-10-CM | POA: Diagnosis not present

## 2021-08-20 DIAGNOSIS — N3289 Other specified disorders of bladder: Secondary | ICD-10-CM | POA: Diagnosis not present

## 2021-08-20 LAB — CBC
HCT: 32.7 % — ABNORMAL LOW (ref 39.0–52.0)
Hemoglobin: 11 g/dL — ABNORMAL LOW (ref 13.0–17.0)
MCH: 33 pg (ref 26.0–34.0)
MCHC: 33.6 g/dL (ref 30.0–36.0)
MCV: 98.2 fL (ref 80.0–100.0)
Platelets: 136 10*3/uL — ABNORMAL LOW (ref 150–400)
RBC: 3.33 MIL/uL — ABNORMAL LOW (ref 4.22–5.81)
RDW: 14.5 % (ref 11.5–15.5)
WBC: 10.1 10*3/uL (ref 4.0–10.5)
nRBC: 0 % (ref 0.0–0.2)

## 2021-08-20 LAB — BASIC METABOLIC PANEL
Anion gap: 5 (ref 5–15)
BUN: 21 mg/dL (ref 8–23)
CO2: 27 mmol/L (ref 22–32)
Calcium: 8.7 mg/dL — ABNORMAL LOW (ref 8.9–10.3)
Chloride: 106 mmol/L (ref 98–111)
Creatinine, Ser: 1.14 mg/dL (ref 0.61–1.24)
GFR, Estimated: >60 mL/min (ref >60)
Glucose, Bld: 104 mg/dL — ABNORMAL HIGH (ref 70–99)
Potassium: 4 mmol/L (ref 3.5–5.1)
Sodium: 138 mmol/L (ref 135–145)

## 2021-08-20 MED ORDER — FUROSEMIDE 10 MG/ML IJ SOLN
40.0000 mg | Freq: Two times a day (BID) | INTRAMUSCULAR | Status: DC
  Administered 2021-08-20 – 2021-08-21 (×2): 40 mg via INTRAVENOUS
  Filled 2021-08-20 (×2): qty 4

## 2021-08-20 MED ORDER — OXYBUTYNIN CHLORIDE 5 MG PO TABS
5.0000 mg | ORAL_TABLET | Freq: Three times a day (TID) | ORAL | Status: DC | PRN
  Filled 2021-08-20: qty 1

## 2021-08-20 NOTE — Evaluation (Signed)
Occupational Therapy Evaluation Patient Details Name: James Mata MRN: [id number] DOB: [birth year] Today's Date: 08/20/2021   History of Present Illness 88 y.o. male with medical history significant for BPH with obstructive symptoms status post chronic indwelling Foley catheter that was recently changed, hypertension who presents to the ER via private vehicle for evaluation of abdominal pain and bloody urine.   Clinical Impression   Pt was seen for OT evaluation this date. Prior to hospital admission, pt was generally independent with basic ADL and medication mgt. He lives with his dtr and son in law who assist with transportation and meals. Pt performed bed mobility with MIN A for trunk elevation and sat EOB for grooming and eating breakfast with set up and supervision, requiring minimal VC for safety to minimize aspiration risk. MIN VC to redirect to tasks. HHA for ADL transfers, MIN A for LB ADL. Currently pt demonstrates impairments as described below (See OT problem list) which functionally limit his ability to perform ADL/self-care tasks. Pt would benefit from skilled OT services to address noted impairments and functional limitations (see below for any additional details) in order to maximize safety and independence while minimizing falls risk and caregiver burden. Upon hospital discharge, recommend HHOT to maximize pt safety and return to functional independence during meaningful occupations of daily life.   Recommendations for follow up therapy are one component of a multi-disciplinary discharge planning process, led by the attending physician.  Recommendations may be updated based on patient status, additional functional criteria and insurance authorization.   Follow Up Recommendations  Home health OT    Assistance Recommended at Discharge Frequent or constant Supervision/Assistance  Patient can return home with the following A little help with walking and/or transfers;A little help  with bathing/dressing/bathroom;Assistance with cooking/housework;Assist for transportation;Help with stairs or ramp for entrance;Direct supervision/assist for medications management    Functional Status Assessment  Patient has had a recent decline in their functional status and demonstrates the ability to make significant improvements in function in a reasonable and predictable amount of time.  Equipment Recommendations  None recommended by OT    Recommendations for Other Services       Precautions / Restrictions Precautions Precautions: Fall Restrictions Weight Bearing Restrictions: No      Mobility Bed Mobility Overal bed mobility: Needs Assistance Bed Mobility: Supine to Sit     Supine to sit: Min assist     General bed mobility comments: MIN A for trunk elevation    Transfers Overall transfer level: Needs assistance Equipment used: 1 person hand held assist Transfers: Sit to/from Stand, Bed to chair/wheelchair/BSC Sit to Stand: Min guard          Lateral/Scoot Transfers: Min guard General transfer comment: handheld assist + time/effort      Balance Overall balance assessment: Needs assistance Sitting-balance support: No upper extremity supported, Feet supported Sitting balance-Leahy Scale: Good     Standing balance support: Single extremity supported Standing balance-Leahy Scale: Fair                             ADL either performed or assessed with clinical judgement   ADL                                         General ADL Comments: Set up and supervision for grooming tasks and for eating breakfast  while seated unsupported EOB, 1 VC for safety     Vision         Perception     Praxis      Pertinent Vitals/Pain Pain Assessment Pain Assessment: No/denies pain     Hand Dominance     Extremity/Trunk Assessment Upper Extremity Assessment Upper Extremity Assessment: Generalized weakness;Overall WFL for tasks  assessed (age appropraite deficits)   Lower Extremity Assessment Lower Extremity Assessment: Overall WFL for tasks assessed       Communication Communication Communication: No difficulties   Cognition Arousal/Alertness: Awake/alert Behavior During Therapy: WFL for tasks assessed/performed Overall Cognitive Status: Within Functional Limits for tasks assessed                                       General Comments       Exercises Other Exercises Other Exercises: Pt educated in falls prevention, safety with eating to minimize aspiration   Shoulder Instructions      Home Living Family/patient expects to be discharged to:: Private residence Living Arrangements: Children Available Help at Discharge: Family Type of Home: House Home Access: Stairs to enter Secretary/administrator of Steps: 1   Home Layout: One level     Bathroom Shower/Tub: Chief Strategy Officer: Standard     Home Equipment: Agricultural consultant (2 wheels)          Prior Functioning/Environment Prior Level of Function : Needs assist             Mobility Comments: Pt reports he has been working with HHPT for 4 weeks, increasing gait distance to >100 and doing a BID exercise routine ADLs Comments: daughter can help but appears he is nearly independent with dressing, bathing, dtr assists with meals, transportation. pt reports indep with medication mgt        OT Problem List: Decreased strength;Decreased activity tolerance;Impaired balance (sitting and/or standing);Decreased knowledge of use of DME or AE      OT Treatment/Interventions: Self-care/ADL training;Therapeutic exercise;Therapeutic activities;DME and/or AE instruction;Energy conservation;Patient/family education;Balance training    OT Goals(Current goals can be found in the care plan section) Acute Rehab OT Goals Patient Stated Goal: go home today with dtr OT Goal Formulation: With patient Time For Goal Achievement:  09/03/21 Potential to Achieve Goals: Good ADL Goals Pt Will Perform Lower Body Dressing: with modified independence;sit to/from stand Pt Will Transfer to Toilet: with modified independence;ambulating (LRAD) Additional ADL Goal #1: Pt will perfrom all aspects of bathing with modified independence.  OT Frequency: Min 2X/week    Co-evaluation              AM-PAC OT "6 Clicks" Daily Activity     Outcome Measure Help from another person eating meals?: A Little Help from another person taking care of personal grooming?: A Little Help from another person toileting, which includes using toliet, bedpan, or urinal?: A Little Help from another person bathing (including washing, rinsing, drying)?: A Little Help from another person to put on and taking off regular upper body clothing?: A Little Help from another person to put on and taking off regular lower body clothing?: A Little 6 Click Score: 18   End of Session    Activity Tolerance: Patient tolerated treatment well Patient left: in bed;with call bell/phone within reach;with bed alarm set  OT Visit Diagnosis: Other abnormalities of gait and mobility (R26.89);Muscle weakness (generalized) (M62.81)  Time: [id number] OT Time Calculation (min): 39 min Charges:  OT General Charges $OT Visit: 1 Visit OT Evaluation $OT Eval Moderate Complexity: 1 Mod OT Treatments $Self Care/Home Management : 23-37 mins  Arman FilterJamie R., MPH, MS, OTR/L ascom [phone] 08/20/21, 9:59 AM

## 2021-08-20 NOTE — Progress Notes (Signed)
Urology Consult Follow Up  Subjective: Patient is resting comfortably.  Urine is clear yellow.    VSS afebrile  His hemoglobin is 11.0 up from 10.4 yesterday.  His hematocrit is up 32.7% from 30.5% yesterday.  His serum creatinine is 1.14.  His urine is clear yellow and three-way Foley is in place with CBI off.  Anti-infectives: Anti-infectives (From admission, onward)    None       Current Facility-Administered Medications  Medication Dose Route Frequency Provider Last Rate Last Admin   0.9 %  sodium chloride infusion  250 mL Intravenous PRN Agbata, Tochukwu, MD       bisacodyl (DULCOLAX) EC tablet 5 mg  5 mg Oral Daily PRN Nicole Kindred A, DO   5 mg at 08/19/21 1046   Chlorhexidine Gluconate Cloth 2 % PADS 6 each  6 each Topical Daily Athena Masse, MD   6 each at 08/19/21 1542   darifenacin (ENABLEX) 24 hr tablet 7.5 mg  7.5 mg Oral Daily Agbata, Tochukwu, MD   7.5 mg at 08/19/21 1043   doxazosin (CARDURA) tablet 4 mg  4 mg Oral QHS Agbata, Tochukwu, MD   4 mg at 08/19/21 2228   finasteride (PROSCAR) tablet 5 mg  5 mg Oral Daily Agbata, Tochukwu, MD   5 mg at 08/19/21 1044   fluticasone (FLONASE) 50 MCG/ACT nasal spray 1 spray  1 spray Each Nare Daily PRN Agbata, Tochukwu, MD       furosemide (LASIX) injection 20 mg  20 mg Intravenous Q12H Agbata, Tochukwu, MD   20 mg at 08/20/21 0503   isosorbide mononitrate (IMDUR) 24 hr tablet 30 mg  30 mg Oral Daily Agbata, Tochukwu, MD   30 mg at 08/18/21 1143   metoprolol succinate (TOPROL-XL) 24 hr tablet 12.5 mg  12.5 mg Oral Daily Agbata, Tochukwu, MD   12.5 mg at 08/18/21 1724   multivitamin with minerals tablet 1 tablet  1 tablet Oral Daily Agbata, Tochukwu, MD   1 tablet at 08/19/21 1044   nitroGLYCERIN (NITROSTAT) SL tablet 0.4 mg  0.4 mg Sublingual Q5 min PRN Agbata, Tochukwu, MD       ondansetron (ZOFRAN) tablet 4 mg  4 mg Oral Q6H PRN Agbata, Tochukwu, MD       Or   ondansetron (ZOFRAN) injection 4 mg  4 mg Intravenous Q6H PRN  Agbata, Tochukwu, MD       polyethylene glycol (MIRALAX / GLYCOLAX) packet 17 g  17 g Oral Daily Nicole Kindred A, DO   17 g at 08/19/21 1046   potassium chloride SA (KLOR-CON M) CR tablet 40 mEq  40 mEq Oral Daily Agbata, Tochukwu, MD   40 mEq at 08/19/21 1045   senna-docusate (Senokot-S) tablet 1 tablet  1 tablet Oral BID Nicole Kindred A, DO   1 tablet at 08/19/21 2228   sodium chloride flush (NS) 0.9 % injection 3 mL  3 mL Intravenous Q12H Agbata, Tochukwu, MD   3 mL at 08/19/21 2230   sodium chloride flush (NS) 0.9 % injection 3 mL  3 mL Intravenous PRN Agbata, Tochukwu, MD         Objective: Vital signs in last 24 hours: Temp:  [97.7 F (36.5 C)-99.2 F (37.3 C)] 98.7 F (37.1 C) (01/27 0452) Pulse Rate:  [54-72] 69 (01/27 0452) Resp:  [18-20] 20 (01/27 0452) BP: (98-133)/(52-66) 112/62 (01/27 0452) SpO2:  [92 %-98 %] 92 % (01/27 0452) Weight:  [63 kg] 63 kg (01/27 0500)  Intake/Output from  previous day: 01/26 0701 - 01/27 0700 In: 2400 [P.O.:2400] Out: 2400 [Urine:2400] Intake/Output this shift: No intake/output data recorded.   Physical Exam Constitutional:  Well nourished. Alert and oriented, No acute distress. HEENT: [place] AT, mask in place.  Trachea midline Cardiovascular: No clubbing, cyanosis, or edema. Respiratory: Normal respiratory effort, no increased work of breathing. GU: No CVA tenderness.  No bladder fullness or masses.  Patient with uncircumcised phallus. Foreskin easily retracted  Urethral meatus is patent.  No penile discharge. No penile lesions or rashes. 3-way Foley in place.  Scrotum without lesions, cysts, rashes and/or edema.   Neurologic: Grossly intact, no focal deficits, moving all 4 extremities. Psychiatric: Normal mood and affect.   Lab Results:  Recent Labs    08/19/21 0637 08/20/21 0433  WBC 10.3 10.1  HGB 10.4* 11.0*  HCT 30.5* 32.7*  PLT 128* 136*   BMET Recent Labs    08/19/21 0637 08/20/21 0433  NA 137 138  K 3.7 4.0  CL  104 106  CO2 26 27  GLUCOSE 108* 104*  BUN 19 21  CREATININE 1.24 1.14  CALCIUM 8.3* 8.7*   PT/INR No results for input(s): LABPROT, INR in the last 72 hours. ABG No results for input(s): PHART, HCO3 in the last 72 hours.  Invalid input(s): PCO2, PO2  Studies/Results: No results found.   Assessment: 88 year old male with a history of BPH and urinary retention managed with indwelling Foley who presented to the ED for gross hematuria and a clotted off indwelling Foley also found to be in acute CHF who was admitted for further investigation into his acute CHF and management of his gross hematuria with CBI.   Urine is now yellow off CBI.     Plan: -no plans for HoLEP today as urine is clear, so can resume diet  -discontinue CBI -discharge patient with Foley -hand irrigate catheter as needed  -continue finasteride 5 mg daily -recommend oxybutynin IR 5 mg TID prn for bladder spasms as Enablex may not be on formulary -follow up in 1 to 2 weeks with Dr. Diamantina Providence for catheter removal and voiding trial      LOS: 3 days    [facility] 08/20/2021

## 2021-08-20 NOTE — Care Management Important Message (Signed)
Important Message  Patient Details  Name: James Mata MRN: [id number] Date of Birth: [birth year]   Medicare Important Message Given:  Yes     [name] 08/20/2021, 11:07 AM

## 2021-08-20 NOTE — Progress Notes (Signed)
Progress Note   Patient: James Mata [id number] DOB: 1931/02/13 DOA: 08/17/2021     3 DOS: the patient was seen and examined on 08/20/2021   Brief hospital course: 88 year old male admitted to the hospital for evaluation of gross hematuria and acute CHF  Assessment and Plan * Acute exacerbation of CHF (congestive heart failure) ([place]) POA, pt had  significant lower extremity swelling and JVD on admission.   No prior echocardiogram on file. BNP 213.4 Echo on 1/25 showed EF 50 to [id number], grade 2 diastolic dysfunction, no LV regional wall motion abnormalities Current home dose Lasix 80 mg twice daily. 1/27: Remains on 2.5 L/min supplemental oxygen, renal function stable Net IO Since Admission: -217 mL [08/20/21 1616]  (I/O's charted appear inaccurate) Weight is down 6.7 kg if accurate. --Continue IV Lasix, increased to 40 mg IV BID --Continue metoprolol and nitrates --I/O's and daily wts --Monitor renal function and electrolytes  Hematuria, gross- (present on admission) Likely due to urethral trauma with Foley exchange day before presenting. --Mgmt per urology  BPH with obstruction/lower urinary tract symptoms- (present on admission) Pt has chronic indwelling Foley. Having obstructive symptoms after Foley was exchanged 1 day prior to this admission.  Presented to the ER with obstructive urinary symptoms and gross hematuria --Urology following, see their recs --CBI discontinued per urology --Irrigate Foley as needed --Continue finasteride, doxazosin, oxybutynin as needed for bladder spasms substituted for Enablex while inpatient --Hold Aspirin --No plan or indication for inpatient procedure at this time --Discharge with three-way catheter, capped inflow -- Outpatient follow-up in 1 to 2 weeks with Dr. Caprice Beaver for voiding trial and discussion of HOLEP  Unspecified atrial fibrillation ([place])- (present on admission) Rate controlled Continue metoprolol CHA2DS2-VASc of 4 and  ideally will require long-term anticoagulation. Hold off on anticoagulation for now due to gross hematuria.   Hypertension- (present on admission) BP overall stable. --Continue metoprolol  CAD (coronary artery disease)- (present on admission) Continue nitrates and beta-blockers Aspirin is on hold due to gross hematuria     Subjective: Patient awake sitting up in bed with son-in-law at bedside when seen today.  Patient reports overall feeling well.  Denies chest pain, shortness of breath, palpitations, abdominal pain nausea or vomiting, fevers or chills.  He looks forward to going home soon agreeable to stay another night.  Objective Vitals reviewed and notable for soft blood pressure this morning 109/64.  Remains on 2.5 L/min nasal cannula oxygen with sats 92 to 96%, intermittent mild bradycardia with heart rate in the mid 50s.  No fevers  General exam: awake, alert, no acute distress, in good spirits HEENT: atraumatic, clear conjunctiva, anicteric sclera, moist mucus membranes, hearing grossly normal  Respiratory system: CTAB with diminished bases, no wheezes, rales or rhonchi, normal respiratory effort. Cardiovascular system: normal S1/S2,  RRR, no pedal edema.   Genitourinary system: Foley catheter in place with clear yellow urine in the bag Central nervous system: A&O x3. no gross focal neurologic deficits, normal speech Extremities: moves all, no edema, normal tone Skin: dry, intact, normal temperature Psychiatry: normal mood, congruent affect, judgement and insight appear normal   Data Reviewed:  Labs reviewed and notable for glucose 104, calcium 8.7, hemoglobin stable 11.0, platelets improved 136  Family Communication: Son-in-law at bedside on rounds.  Daughter on speaker phone during encounter  Disposition: Status is: Inpatient  Remains inpatient appropriate because: Severity of illness remaining on IV diuresis for volume overload and acute respiratory failure with  hypoxia still needing oxygen.  Anticipate discharge  in 24 to 48 hours if weaned off oxygen.         Time spent: 35 minutes  Author: Ezekiel Slocumb, DO 08/20/2021 4:21 PM  For on call review [email or web address].

## 2021-08-20 NOTE — Progress Notes (Signed)
Physical Therapy Treatment Patient Details Name: James Mata MRN: [id number] DOB: [birth year] Today's Date: 08/20/2021   History of Present Illness 88 y.o. male with medical history significant for BPH with obstructive symptoms status post chronic indwelling Foley catheter that was recently changed, hypertension who presents to the ER via private vehicle for evaluation of abdominal pain and bloody urine.    PT Comments    Patient tolerated session well and was agreeable to treatment. Upon arrival patient was supine with HOB elevated resting. Patient was very eager to participate in physical therapy and show off his home exercises he does at home. He is also very HOH, and requires constant cueing to remain on task. Patient demonstrated mini squats, standing marches, and side steps with SBA and BUE support from RW. Patient also demonstrated increased tolerance with walking, completed 2 laps around the nurses station with RW and O2 remaining >92% on 3L via [place]. HR ranged from 55-103bpm throughout session. Patient would continue to benefit from skilled physical therapy in order to optimize patient's return to his PLOF. Continue to recommend HHPT upon discharge from acute hospitalization.    Recommendations for follow up therapy are one component of a multi-disciplinary discharge planning process, led by the attending physician.  Recommendations may be updated based on patient status, additional functional criteria and insurance authorization.  Follow Up Recommendations  Home health PT     Assistance Recommended at Discharge PRN  Patient can return home with the following A little help with bathing/dressing/bathroom;Assistance with cooking/housework;Assist for transportation   Equipment Recommendations       Recommendations for Other Services       Precautions / Restrictions Precautions Precautions: Fall Restrictions Weight Bearing Restrictions: No     Mobility  Bed Mobility Overal  bed mobility: Needs Assistance Bed Mobility: Supine to Sit     Supine to sit: Min assist     General bed mobility comments: MIN A for trunk elevation    Transfers Overall transfer level: Needs assistance Equipment used: 1 person hand held assist Transfers: Sit to/from Stand Sit to Stand: Min guard                Ambulation/Gait Ambulation/Gait assistance: Supervision Gait Distance (Feet): 320 Feet Assistive device: Rolling walker (2 wheels) Gait Pattern/deviations: Step-through pattern, Decreased step length - right, Decreased step length - left, Trunk flexed, Narrow base of support Gait velocity: decreased     General Gait Details: constant cueing for upright gaze, signifncant kyphosis of thoracic spine, completed on 3L O2 via [place] (O2 remained >92% throughout ambulation bout) No LOB noted however gait speed slow   Stairs             Wheelchair Mobility    Modified Rankin (Stroke Patients Only)       Balance                                            Cognition Arousal/Alertness: Awake/alert Behavior During Therapy: WFL for tasks assessed/performed Overall Cognitive Status: Within Functional Limits for tasks assessed                                 General Comments: Patient is very talkative, needs constant cueing for redirection back to task, very HOH, showed good general situational awareness  Exercises Other Exercises Other Exercises: x10 Standing marches in place with BUE support from RW; completed SBA Other Exercises: x10 side steps bilaterally with BUE support from RW; completed SBA Other Exercises: x10 mini squats completed with BUE support from RW; completed SBA    General Comments        Pertinent Vitals/Pain Pain Assessment Pain Assessment: 0-10 Pain Intervention(s): Limited activity within patient's tolerance, Monitored during session, Repositioned    Home Living                           Prior Function            PT Goals (current goals can now be found in the care plan section) Acute Rehab PT Goals Patient Stated Goal: Go home PT Goal Formulation: With patient Time For Goal Achievement: 09/02/21 Potential to Achieve Goals: Good Progress towards PT goals: Progressing toward goals    Frequency    Min 2X/week      PT Plan Current plan remains appropriate    Co-evaluation              AM-PAC PT "6 Clicks" Mobility   Outcome Measure  Help needed turning from your back to your side while in a flat bed without using bedrails?: A Little Help needed moving from lying on your back to sitting on the side of a flat bed without using bedrails?: A Little Help needed moving to and from a bed to a chair (including a wheelchair)?: A Little Help needed standing up from a chair using your arms (e.g., wheelchair or bedside chair)?: A Little Help needed to walk in hospital room?: A Little Help needed climbing 3-5 steps with a railing? : A Little 6 Click Score: 18    End of Session Equipment Utilized During Treatment: Gait belt;Oxygen (2.5-3 L) Activity Tolerance: Patient tolerated treatment well Patient left: in bed;with call bell/phone within reach;with bed alarm set Nurse Communication: Mobility status PT Visit Diagnosis: Muscle weakness (generalized) (M62.81);Difficulty in walking, not elsewhere classified (R26.2)     Time: 6222-9798 PT Time Calculation (min) (ACUTE ONLY): 25 min  Charges:  $Gait Training: 8-22 mins $Therapeutic Exercise: 8-22 mins                     Angelica Ran, PT  08/20/21. 2:51 PM

## 2021-08-21 ENCOUNTER — Inpatient Hospital Stay: Payer: Medicare PPO

## 2021-08-21 DIAGNOSIS — R509 Fever, unspecified: Secondary | ICD-10-CM | POA: Diagnosis not present

## 2021-08-21 DIAGNOSIS — I509 Heart failure, unspecified: Secondary | ICD-10-CM | POA: Diagnosis not present

## 2021-08-21 DIAGNOSIS — N401 Enlarged prostate with lower urinary tract symptoms: Secondary | ICD-10-CM | POA: Diagnosis not present

## 2021-08-21 DIAGNOSIS — N138 Other obstructive and reflux uropathy: Secondary | ICD-10-CM | POA: Diagnosis not present

## 2021-08-21 DIAGNOSIS — R310 Gross hematuria: Secondary | ICD-10-CM | POA: Diagnosis not present

## 2021-08-21 LAB — URINALYSIS, COMPLETE (UACMP) WITH MICROSCOPIC
Bilirubin Urine: NEGATIVE
Glucose, UA: NEGATIVE mg/dL
Ketones, ur: NEGATIVE mg/dL
Nitrite: POSITIVE — AB
Protein, ur: 100 mg/dL — AB
RBC / HPF: >50 RBC/hpf — ABNORMAL HIGH (ref 0–5)
Specific Gravity, Urine: 1.015 (ref 1.005–1.030)
Squamous Epithelial / HPF: NONE SEEN (ref 0–5)
WBC, UA: >50 WBC/hpf — ABNORMAL HIGH (ref 0–5)
pH: 8.5 — ABNORMAL HIGH (ref 5.0–8.0)

## 2021-08-21 LAB — RESP PANEL BY RT-PCR (FLU A&B, COVID) ARPGX2
Influenza A by PCR: NEGATIVE
Influenza B by PCR: NEGATIVE
SARS Coronavirus 2 by RT PCR: NEGATIVE

## 2021-08-21 LAB — BASIC METABOLIC PANEL
Anion gap: 9 (ref 5–15)
BUN: 28 mg/dL — ABNORMAL HIGH (ref 8–23)
CO2: 26 mmol/L (ref 22–32)
Calcium: 8.9 mg/dL (ref 8.9–10.3)
Chloride: 101 mmol/L (ref 98–111)
Creatinine, Ser: 1.27 mg/dL — ABNORMAL HIGH (ref 0.61–1.24)
GFR, Estimated: 54 mL/min — ABNORMAL LOW (ref >60)
Glucose, Bld: 124 mg/dL — ABNORMAL HIGH (ref 70–99)
Potassium: 4.4 mmol/L (ref 3.5–5.1)
Sodium: 136 mmol/L (ref 135–145)

## 2021-08-21 LAB — CBC
HCT: 31 % — ABNORMAL LOW (ref 39.0–52.0)
Hemoglobin: 10.4 g/dL — ABNORMAL LOW (ref 13.0–17.0)
MCH: 32 pg (ref 26.0–34.0)
MCHC: 33.5 g/dL (ref 30.0–36.0)
MCV: 95.4 fL (ref 80.0–100.0)
Platelets: 142 10*3/uL — ABNORMAL LOW (ref 150–400)
RBC: 3.25 MIL/uL — ABNORMAL LOW (ref 4.22–5.81)
RDW: 14.5 % (ref 11.5–15.5)
WBC: 10.2 10*3/uL (ref 4.0–10.5)
nRBC: 0 % (ref 0.0–0.2)

## 2021-08-21 MED ORDER — FUROSEMIDE 40 MG PO TABS
80.0000 mg | ORAL_TABLET | Freq: Two times a day (BID) | ORAL | Status: DC
  Administered 2021-08-21 – 2021-08-22 (×2): 80 mg via ORAL
  Filled 2021-08-21 (×2): qty 2

## 2021-08-21 NOTE — Assessment & Plan Note (Addendum)
Patient had fever 100.4 at 11:30 PM last night.  Chest x-ray without signs of pneumonia.  Admission urine culture had never been sent, re-ordered and pending.  Patient reports feeling like he may have a mild "cold".  Retested and negative for influenza and COVID-19 today. No GI or systemic symptoms. -- Hold off antibiotics for now --monitor clinically -- Follow-up urine culture  1/29: no more fevers. Awaiting urine culture. Not on abx.

## 2021-08-21 NOTE — Progress Notes (Signed)
Progress Note   Patient: James Mata [id number] DOB: 1931-07-09 DOA: 08/17/2021     4 DOS: the patient was seen and examined on 08/21/2021   Brief hospital course: 88 year old male admitted to the hospital for evaluation of gross hematuria and acute CHF  Assessment and Plan * Acute exacerbation of CHF (congestive heart failure) ([place]) POA, pt had  significant lower extremity swelling and JVD on admission.   No prior echocardiogram on file. BNP 213.4 Echo on 1/25 showed EF 50 to [id number], grade 2 diastolic dysfunction, no LV regional wall motion abnormalities Current home dose Lasix 80 mg twice daily. 1/28: Weaning down oxygen, this morning on 1 L/min  Net IO Since Admission: -2,037 mL [08/21/21 1701] (I/O's charted appear inaccurate) Weight is down over 6 kg if accurate. Status post IV Lasix, initially 20>> 40 mg IV twice daily --Transition Lasix to home p.o. 80 mg twice daily --Continue metoprolol and nitrates --I/O's and daily wts --Monitor renal function and electrolytes  Fever Patient had fever 100.4 at 11:30 PM last night.  Chest x-ray without signs of pneumonia.  Admission urine culture had never been sent, re-ordered and pending.  Patient reports feeling like he may have a mild "cold".  Retested and negative for influenza and COVID-19 today. No GI or systemic symptoms. -- Hold off antibiotics for now --monitor clinically -- Follow-up urine culture  Hematuria, gross- (present on admission) Resolved.  Likely due to urethral trauma with Foley exchange day before presenting. --Mgmt per urology  BPH with obstruction/lower urinary tract symptoms- (present on admission) Pt has chronic indwelling Foley. Having obstructive symptoms after Foley was exchanged 1 day prior to this admission.  Presented to the ER with obstructive urinary symptoms and gross hematuria --Urology following, see their recs --CBI discontinued per urology --Irrigate Foley as needed --Continue finasteride,  doxazosin, oxybutynin as needed for bladder spasms substituted for Enablex while inpatient --Hold Aspirin --No plan or indication for inpatient procedure at this time --Discharge with three-way catheter, capped inflow -- Outpatient follow-up in 1 to 2 weeks with Dr. Caprice Beaver for voiding trial and discussion of HOLEP  Unspecified atrial fibrillation ([place])- (present on admission) Rate controlled Continue metoprolol CHA2DS2-VASc of 4 and ideally will require long-term anticoagulation. Hold off on anticoagulation for now due to gross hematuria.  Consider resuming tomorrow if still no bleeding.   Hypertension- (present on admission) BP overall stable. --Continue metoprolol  CAD (coronary artery disease)- (present on admission) Continue nitrates and beta-blockers Aspirin is on hold due to gross hematuria.  Resume aspirin tomorrow if still no bleeding     Subjective: Patient was awake sitting up in bed when seen today.  He reports feeling like he may be coming down with a mild cold.  He did say he felt hot overnight but denies chills.  States his throat feels dry but not sore.  He feels a little congested.  No body aches.  Denies chest pain shortness of breath or palpitations.  No other acute complaints.  Objective Vitals reviewed and notable for mildly soft blood pressures 108/48 late this morning.  Fever 100.4 at 11:35 PM last night.  Mild bradycardia with heart rate in the 50s to 60  General exam: awake, alert, no acute distress, appears fatigued HEENT: moist mucus membranes, hearing grossly normal  Respiratory system: CTAB with diminished bases, no wheezes, rales or rhonchi, normal respiratory effort at rest on 1 L/min [place] O2. Cardiovascular system: normal S1/S2, RRR, JVP, no pedal edema.   Gastrointestinal system: soft, nontender  nondistended abdomen Central nervous system: A&O x3. no gross focal neurologic deficits, normal speech Skin: dry, intact, normal temperature Psychiatry:  normal mood, congruent affect, judgement and insight appear normal   Data Reviewed:  Labs reviewed and notable for slight rise in creatinine from 1.14-1.27.  Hemoglobin 10.4.  Platelets improved to 142 from 136.  Negative for influenza A/B and COVID-19 PCR.  Urinalysis with large hemoglobin, large leukocytes, positive nitrite, many bacteria, greater than 50 WBCs, WBC clumps present, hyaline casts present, mucus present, greater than 50 RBCs  Family Communication: Spoke with daughter by phone this afternoon and updated on status and plan of care.  Agreeable for monitoring another 24 hours in the setting of fever overnight.  Disposition: Status is: Inpatient  Remains inpatient appropriate because: Severity of illness with fever overnight and evaluation of this ongoing         Time spent: 35 minutes  Author: Ezekiel Slocumb, DO 08/21/2021 5:06 PM  For on call review [email or web address].

## 2021-08-22 ENCOUNTER — Inpatient Hospital Stay: Payer: Medicare PPO

## 2021-08-22 DIAGNOSIS — I2510 Atherosclerotic heart disease of native coronary artery without angina pectoris: Secondary | ICD-10-CM | POA: Diagnosis not present

## 2021-08-22 DIAGNOSIS — I5033 Acute on chronic diastolic (congestive) heart failure: Secondary | ICD-10-CM | POA: Diagnosis not present

## 2021-08-22 DIAGNOSIS — R509 Fever, unspecified: Secondary | ICD-10-CM | POA: Diagnosis not present

## 2021-08-22 DIAGNOSIS — J9601 Acute respiratory failure with hypoxia: Secondary | ICD-10-CM | POA: Insufficient documentation

## 2021-08-22 DIAGNOSIS — J9611 Chronic respiratory failure with hypoxia: Secondary | ICD-10-CM

## 2021-08-22 DIAGNOSIS — I4891 Unspecified atrial fibrillation: Secondary | ICD-10-CM

## 2021-08-22 DIAGNOSIS — N401 Enlarged prostate with lower urinary tract symptoms: Secondary | ICD-10-CM | POA: Diagnosis not present

## 2021-08-22 LAB — URINE CULTURE

## 2021-08-22 LAB — BRAIN NATRIURETIC PEPTIDE: B Natriuretic Peptide: 183.1 pg/mL — ABNORMAL HIGH (ref 0.0–100.0)

## 2021-08-22 LAB — PROCALCITONIN: Procalcitonin: 0.12 ng/mL

## 2021-08-22 LAB — CBC
HCT: 32 % — ABNORMAL LOW (ref 39.0–52.0)
Hemoglobin: 11 g/dL — ABNORMAL LOW (ref 13.0–17.0)
MCH: 32.4 pg (ref 26.0–34.0)
MCHC: 34.4 g/dL (ref 30.0–36.0)
MCV: 94.4 fL (ref 80.0–100.0)
Platelets: 160 10*3/uL (ref 150–400)
RBC: 3.39 MIL/uL — ABNORMAL LOW (ref 4.22–5.81)
RDW: 14.5 % (ref 11.5–15.5)
WBC: 10.7 10*3/uL — ABNORMAL HIGH (ref 4.0–10.5)
nRBC: 0 % (ref 0.0–0.2)

## 2021-08-22 LAB — BASIC METABOLIC PANEL
Anion gap: 9 (ref 5–15)
BUN: 32 mg/dL — ABNORMAL HIGH (ref 8–23)
CO2: 26 mmol/L (ref 22–32)
Calcium: 8.8 mg/dL — ABNORMAL LOW (ref 8.9–10.3)
Chloride: 102 mmol/L (ref 98–111)
Creatinine, Ser: 1.39 mg/dL — ABNORMAL HIGH (ref 0.61–1.24)
GFR, Estimated: 49 mL/min — ABNORMAL LOW (ref >60)
Glucose, Bld: 116 mg/dL — ABNORMAL HIGH (ref 70–99)
Potassium: 4.3 mmol/L (ref 3.5–5.1)
Sodium: 137 mmol/L (ref 135–145)

## 2021-08-22 NOTE — TOC Transition Note (Signed)
Transition of Care [facility]) - CM/SW Discharge Note   Patient Details  Name: James Mata MRN: [id number] Date of Birth: 10-09-1930  Transition of Care [facility] CM/SW Contact:  Maree Krabbe, LCSW Phone Number: 08/22/2021, 11:58 AM   Clinical Narrative:   02 ordered through [employer]--will be delivered to bedside prior to dc. [facility] orders place for [facility]. [facility] will continue services. NO additional needs.    Final next level of care: Home w Home Health Services Barriers to Discharge: No Barriers Identified   Patient Goals and CMS Choice Patient states their goals for this hospitalization and ongoing recovery are:: Patient wants to go home CMS Medicare.gov Compare Post Acute Care list provided to:: Patient Choice offered to / list presented to : Patient, Adult Children  Discharge Placement                    Patient and family notified of of transfer: 08/22/21  Discharge Plan and Services   Discharge Planning Services: CM Consult            DME Arranged: Oxygen DME Agency: [employer] Date DME Agency Contacted: 08/22/21 Time DME Agency Contacted: 1157   HH Arranged: RN, PT, OT HH Agency: [facility] Date HH Agency Contacted: 08/22/21 Time HH Agency Contacted: 1158 Representative spoke with at [facility] Agency: [place]  Social Determinants of Health (SDOH) Interventions     Readmission Risk Interventions No flowsheet data found.

## 2021-08-22 NOTE — Progress Notes (Signed)
SATURATION QUALIFICATIONS: (This note is used to comply with regulatory documentation for home oxygen)  Patient Saturations on Room Air at Rest = 98%  Patient Saturations on Room Air while Ambulating = 82%  Patient Saturations on 2 Liters of oxygen while Ambulating = 92%  Please briefly explain why patient needs home oxygen:

## 2021-08-22 NOTE — Assessment & Plan Note (Signed)
1/29: pt continued to need supplemental O2. Pt qualified for home O2 with RA sats while ambulating of 82%. He required 2 L/min. Home health O2 ordered for home.

## 2021-08-22 NOTE — Discharge Summary (Addendum)
Physician Discharge Summary   Patient name: James Mata  Admit date:     08/17/2021  Discharge date: 08/22/2021  Attending Physician: Athena Masse [id number]  Discharge Physician: Kristopher Oppenheim   PCP: Lenard Simmer, MD    Recommendations at discharge: f/u with PCP in 2 weeks  Discharge Diagnoses Principal Problem:   Acute exacerbation of CHF (congestive heart failure) ([place]) Active Problems:   Acute respiratory failure with hypoxia, on home O2 therapy ([place]) -  now requiring 2 L/min   BPH with obstruction/lower urinary tract symptoms   Hypertension   Unspecified atrial fibrillation (HCC)   CAD (coronary artery disease)   Resolved Diagnoses Resolved Problems:   Hematuria, gross   Fever   Hospital Course   88 year old male admitted to the hospital for evaluation of gross hematuria and acute CHF   * Acute exacerbation of CHF (congestive heart failure) ([place]) POA, pt had  significant lower extremity swelling and JVD on admission.   No prior echocardiogram on file. BNP 213.4 Echo on 1/25 showed EF 50 to [id number], grade 2 diastolic dysfunction, no LV regional wall motion abnormalities Current home dose Lasix 80 mg twice daily. 1/28: Weaning down oxygen, this morning on 1 L/min  Net IO Since Admission: -2,037 mL [08/21/21 1701] (I/O's charted appear inaccurate) Weight is down over 6 kg if accurate. Status post IV Lasix, initially 20>> 40 mg IV twice daily --Transition Lasix to home p.o. 80 mg twice daily --Continue metoprolol and nitrates --I/O's and daily wts --Monitor renal function and electrolytes  1/29: appears euvolemic. BNP 183. Admission BNP 213. On po lasix. Will try and wean off supplemental O2. Pt lives with dtr and son-in-law.  Acute respiratory failure with hypoxia, now requiring home O2 therapy (HCC) - 2 L/min  Pt was not on home O2 prior to admission. Pt with 30 pack/year hx of smoking. He quit smoking 20 years ago.  Pt continued to need supplemental O2.   Home O2 testing performed during hospitalization.  1/29: pt continued to need supplemental O2. Pt qualified for home O2 with RA sats while ambulating of 82%. He required 2 L/min. Home health O2 ordered for home.  Hematuria, gross-resolved as of 08/22/2021, (present on admission) Resolved.  Likely due to urethral trauma with Foley exchange day before presenting. --Mgmt per urology  1/29: resolved  Fever-resolved as of 08/22/2021 Patient had fever 100.4 at 11:30 PM last night.  Chest x-ray without signs of pneumonia.  Admission urine culture had never been sent, re-ordered and pending.  Patient reports feeling like he may have a mild "cold".  Retested and negative for influenza and COVID-19 today. No GI or systemic symptoms. -- Hold off antibiotics for now --monitor clinically -- Follow-up urine culture  1/29: no more fevers. Awaiting urine culture. Not on abx.  CAD (coronary artery disease)- (present on admission) Continue nitrates and beta-blockers Aspirin is on hold due to gross hematuria.  Resume aspirin tomorrow if still no bleeding  1/29: restart ASA at discharge.  Unspecified atrial fibrillation ([place])- (present on admission) Rate controlled Continue metoprolol CHA2DS2-VASc of 4 and ideally will require long-term anticoagulation. Hold off on anticoagulation for now due to gross hematuria.  Consider resuming tomorrow if still no bleeding.  1/29: can restart ASA at discharge.  Hypertension- (present on admission) BP overall stable. --Continue metoprolol  BPH with obstruction/lower urinary tract symptoms- (present on admission) Pt has chronic indwelling Foley. Having obstructive symptoms after Foley was exchanged 1 day prior to this admission.  Presented to the ER with obstructive urinary symptoms and gross hematuria --Urology following, see their recs --CBI discontinued per urology --Irrigate Foley as needed --Continue finasteride, doxazosin, oxybutynin as needed for bladder  spasms substituted for Enablex while inpatient --Hold Aspirin --No plan or indication for inpatient procedure at this time --Discharge with three-way catheter, capped inflow -- Outpatient follow-up in 1 to 2 weeks with Dr. Caprice Beaver for voiding trial and discussion of HOLEP   Procedures performed: echo. LVEF 50-55%   Condition at discharge: fair  Exam BP (!) 103/53 (BP Location: Left Arm)    Pulse (!) 55    Temp 98 F (36.7 C) (Oral)    Resp 17    Ht 5\' 7"  (1.702 m)    Wt 65.2 kg    SpO2 95%    BMI 22.52 kg/m   Physical Exam Vitals and nursing note reviewed.  Cardiovascular:     Rate and Rhythm: Normal rate.  Pulmonary:     Effort: No respiratory distress.  Neurological:     General: No focal deficit present.     Mental Status: He is oriented to person, place, and time.   DC labs: BMP Latest Ref Rng & Units 08/22/2021 08/21/2021 08/20/2021  Glucose 70 - 99 mg/dL 116(H) 124(H) 104(H)  BUN 8 - 23 mg/dL 32(H) 28(H) 21  Creatinine 0.61 - 1.24 mg/dL 1.39(H) 1.27(H) 1.14  BUN/Creat Ratio 10 - 24 - - -  Sodium 135 - 145 mmol/L 137 136 138  Potassium 3.5 - 5.1 mmol/L 4.3 4.4 4.0  Chloride 98 - 111 mmol/L 102 101 106  CO2 22 - 32 mmol/L 26 26 27   Calcium 8.9 - 10.3 mg/dL 8.8(L) 8.9 8.7(L)   CBC Latest Ref Rng & Units 08/22/2021 08/21/2021 08/20/2021  WBC 4.0 - 10.5 K/uL 10.7(H) 10.2 10.1  Hemoglobin 13.0 - 17.0 g/dL 11.0(L) 10.4(L) 11.0(L)  Hematocrit 39.0 - 52.0 % 32.0(L) 31.0(L) 32.7(L)  Platelets 150 - 400 K/uL 160 142(L) 136(L)   BNP (last 3 results) Recent Labs    08/18/21 0728 08/22/21 0524  BNP 213.4* 183.1*    ProBNP (last 3 results) No results for input(s): PROBNP in the last 8760 hours.  ECHO 1. Left ventricular ejection fraction, by estimation, is 50 to 55%. The  left ventricle has low normal function. The left ventricle has no regional  wall motion abnormalities. Left ventricular diastolic parameters are  consistent with Grade II diastolic  dysfunction  (pseudonormalization).   2. Right ventricular systolic function is low normal. The right  ventricular size is mildly enlarged. Mildly increased right ventricular  wall thickness.   3. The mitral valve is normal in structure. Trivial mitral valve  regurgitation.   4. The aortic valve is grossly normal. Aortic valve regurgitation is not  visualized.   FINDINGS   Left Ventricle: Left ventricular ejection fraction, by estimation, is 50  to 55%. The left ventricle has low normal function. The left ventricle has  no regional wall motion abnormalities. The left ventricular internal  cavity size was normal in size.  There is no left ventricular hypertrophy. Left ventricular diastolic  parameters are consistent with Grade II diastolic dysfunction  (pseudonormalization).   Right Ventricle: The right ventricular size is mildly enlarged. Mildly  increased right ventricular wall thickness. Right ventricular systolic  function is low normal.   Left Atrium: Left atrial size was normal in size.   Right Atrium: Right atrial size was normal in size.   Pericardium: There is no evidence of pericardial  effusion.   Mitral Valve: The mitral valve is normal in structure. Trivial mitral  valve regurgitation. MV peak gradient, 4.5 mmHg. The mean mitral valve  gradient is 2.0 mmHg.   Tricuspid Valve: The tricuspid valve is normal in structure. Tricuspid  valve regurgitation is mild.   Aortic Valve: The aortic valve is grossly normal. Aortic valve  regurgitation is not visualized. Aortic valve mean gradient measures 2.0  mmHg. Aortic valve peak gradient measures 3.7 mmHg. Aortic valve area, by  VTI measures 3.63 cm.   Pulmonic Valve: The pulmonic valve was normal in structure. Pulmonic valve  regurgitation is not visualized.   Aorta: The ascending aorta was not well visualized.   LE U/S IMPRESSION: No evidence of acute left lower extremity DVT.      Disposition: Home health RN,  PT  Discharge time: greater than 30 minutes. Allergies as of 08/22/2021   No Known Allergies      Medication List     STOP taking these medications    fluticasone 50 MCG/ACT nasal spray Commonly known as: FLONASE   molnupiravir EUA 200 MG Caps capsule Commonly known as: LAGEVRIO   mometasone 0.1 % cream Commonly known as: ELOCON       TAKE these medications    acetaminophen 500 MG tablet Commonly known as: TYLENOL Take 500 mg by mouth every 6 (six) hours as needed for mild pain, moderate pain, headache or fever. Take with tramadol in the morning   aspirin 81 MG EC tablet Take 81 mg by mouth daily.   cyclobenzaprine 5 MG tablet Commonly known as: FLEXERIL Take 5 mg by mouth at bedtime.   doxazosin 4 MG tablet Commonly known as: CARDURA Take 4 mg by mouth at bedtime.   finasteride 5 MG tablet Commonly known as: PROSCAR TAKE 1 TABLET (5 MG TOTAL) BY MOUTH DAILY.   furosemide 80 MG tablet Commonly known as: LASIX Take 80 mg by mouth 2 (two) times daily. AM WITH BREAKFAST, AND THEN 1 AT [facility]   isosorbide mononitrate 60 MG 24 hr tablet Commonly known as: IMDUR Take 30 mg by mouth daily.   metoprolol succinate 25 MG 24 hr tablet Commonly known as: TOPROL-XL Take 12.5 mg by mouth daily.   multivitamin with minerals tablet Take 1 tablet by mouth daily.   nitroGLYCERIN 0.4 MG SL tablet Commonly known as: NITROSTAT Place 0.4 mg under the tongue every 5 (five) minutes as needed for chest pain.   traMADol 50 MG tablet Commonly known as: ULTRAM Take 50 mg by mouth 2 (two) times daily as needed for severe pain.   trospium 20 MG tablet Commonly known as: SANCTURA Take 1 tablet (20 mg total) by mouth daily.               Durable Medical Equipment  (From admission, onward)           Start     Ordered   08/22/21 1156  For home use only DME oxygen  Once       Question Answer Comment  Length of Need Lifetime   Liters per Minute 2    Frequency Continuous (stationary and portable oxygen unit needed)   Oxygen conserving device Yes   Oxygen delivery system Gas      08/22/21 1155            CT CHEST WO CONTRAST  Result Date: 08/17/2021 CLINICAL DATA:  Pneumonia, complications suspected, x-ray done. EXAM: CT CHEST WITHOUT CONTRAST TECHNIQUE: Multidetector CT imaging of  the chest was performed following the standard protocol without IV contrast. RADIATION DOSE REDUCTION: This exam was performed according to the departmental dose-optimization program which includes automated exposure control, adjustment of the mA and/or kV according to patient size and/or use of iterative reconstruction technique. COMPARISON:  Chest radiograph 08/17/2021 FINDINGS: Cardiovascular: Ascending thoracic aorta measures up to 3.8 cm. Descending thoracic aorta measures 3.3 cm. Atherosclerotic calcifications in the thoracic aorta. Heart size is enlarged with small to moderate amount of pericardial fluid. Diffuse coronary artery calcifications particularly in the LAD. Mediastinum/Nodes: Difficult to exclude supraclavicular lymph nodes versus enlarged venous structures particularly on the right side. Thyroid tissue appears to be heterogeneous. Subcarinal tissue measures 1.1 cm in the short axis on sequence 2, image 64. Limited evaluation for hilar lymphadenopathy without intravascular contrast. No significant axillary lymph node enlargement. Lungs/Pleura: Trachea and mainstem bronchi are patent. Dependent densities in the lower lobes bilaterally. No significant pleural fluid. No significant airspace disease or consolidation in the lungs. 3 mm pleural-based calcification in the posterior left upper lobe on sequence 3 image 27. Upper Abdomen: Numerous calcified stones in the gallbladder. The gallbladder appears to be completely stone filled. No significant gallbladder inflammatory changes but the gallbladder is not completely imaged. Large low-density structure in  the right renal hilum could represent a large parapelvic cyst measuring up to 4.1 cm but the kidneys are incompletely evaluated. Calcifications and fullness in the left adrenal gland is nonspecific Musculoskeletal: No acute bone abnormality. Disc space narrowing at C7-T1. IMPRESSION: 1. No acute abnormality in the chest. Dependent densities in both lower lobes are most compatible with atelectasis. No evidence for pneumonia. 2. Cardiomegaly with small to moderate amount of pericardial fluid. 3. Aortic Atherosclerosis (ICD10-I70.0). Coronary artery calcifications 4. Cholelithiasis. 5. Probable large right parapelvic renal cyst but incompletely evaluated. Electronically Signed   By: Richarda OverlieAdam  Henn M.D.   On: 08/17/2021 09:27   US Venous Img Lower Unilateral Left (DVT)  Result Date: 08/17/2021 CLINICAL DATA:  Bilateral lower extremity swelling, left greater than right. Clinical concern for DVT. EXAM: LEFT LOWER EXTREMITY VENOUS DOPPLER ULTRASOUND TECHNIQUE: Gray-scale sonography with compression, as well as color and duplex ultrasound, were performed to evaluate the deep venous system(s) from the level of the common femoral vein through the popliteal and proximal calf veins. COMPARISON:  None. FINDINGS: VENOUS Normal compressibility of the common femoral, superficial femoral, and popliteal veins, as well as the visualized calf veins. Visualized portions of profunda femoral vein and great saphenous vein unremarkable. No filling defects to suggest DVT on grayscale or color Doppler imaging. Doppler waveforms show normal direction of venous flow, normal respiratory plasticity and response to augmentation. Limited views of the contralateral common femoral vein are unremarkable. OTHER Mild soft tissue edema noted in the lower leg without apparent focal fluid collection. Limitations: none IMPRESSION: No evidence of acute left lower extremity DVT. Electronically Signed   By: Carey BullocksWilliam  Veazey M.D.   On: 08/17/2021 09:03   DG  Chest Port 1 View  Result Date: 08/22/2021 CLINICAL DATA:  Dyspnea and fever. EXAM: PORTABLE CHEST 1 VIEW COMPARISON:  08/13/2021 and older studies. FINDINGS: Stable cardiac silhouette. Linear/reticular lung base opacities are noted consistent with scarring, atelectasis or a combination, also stable. Lungs otherwise clear. No convincing pleural effusion.  No pneumothorax. IMPRESSION: 1. No acute cardiopulmonary disease and no change from the previous day's exam. Electronically Signed   By: Amie Portlandavid  Ormond M.D.   On: 08/22/2021 11:14   DG Chest Port 1 View  Result  Date: 08/21/2021 CLINICAL DATA:  Fever EXAM: PORTABLE CHEST 1 VIEW COMPARISON:  Chest CT August 17, 2021 FINDINGS: Similar cardiomegaly. Aortic atherosclerosis. Dependent subsegmental atelectasis. No visible pleural effusion or pneumothorax. Spondylosis. IMPRESSION: Cardiomegaly. Dependent subsegmental atelectasis. Electronically Signed   By: Dahlia Bailiff M.D.   On: 08/21/2021 11:03   DG Chest Portable 1 View  Result Date: 08/17/2021 CLINICAL DATA:  Hypoxia. EXAM: PORTABLE CHEST 1 VIEW COMPARISON:  None. FINDINGS: Bilateral lower lung field interstitial prominence may represent atelectasis or scarring. Developing infiltrate is less likely but not excluded clinical correlation is recommended. No focal consolidation, pleural effusion, or pneumothorax. Mild cardiomegaly. Atherosclerotic calcification of the aorta. No acute osseous pathology. IMPRESSION: 1. Bibasilar atelectasis or scarring. No focal consolidation. 2. Mild cardiomegaly. Electronically Signed   By: Anner Crete M.D.   On: 08/17/2021 03:19   ECHOCARDIOGRAM COMPLETE  Result Date: 08/17/2021    ECHOCARDIOGRAM REPORT   Patient Name:   ZAMIER MESSERLI [facility] Date of Exam: 08/17/2021 Medical Rec #:  [id number]       Height:       67.0 in Accession #:    [id number]      Weight:       154.0 lb Date of Birth:  03-14-1931       BSA:          1.810 m Patient Age:    5 years        BP:            120/70 mmHg Patient Gender: M               HR:           71 bpm. Exam Location:  [facility] Procedure: 2D Echo, Cardiac Doppler and Color Doppler Indications:     CHF-acute diastolic [id number]  History:         Patient has no prior history of Echocardiogram examinations.                  CHF; Risk Factors:Dyslipidemia and Hypertension.  Sonographer:     Sherrie Sport Referring Phys:  [id number] AGBATA Diagnosing Phys: Yolonda Kida MD  Sonographer Comments: Suboptimal apical window. IMPRESSIONS  1. Left ventricular ejection fraction, by estimation, is 50 to 55%. The left ventricle has low normal function. The left ventricle has no regional wall motion abnormalities. Left ventricular diastolic parameters are consistent with Grade II diastolic dysfunction (pseudonormalization).  2. Right ventricular systolic function is low normal. The right ventricular size is mildly enlarged. Mildly increased right ventricular wall thickness.  3. The mitral valve is normal in structure. Trivial mitral valve regurgitation.  4. The aortic valve is grossly normal. Aortic valve regurgitation is not visualized. FINDINGS  Left Ventricle: Left ventricular ejection fraction, by estimation, is 50 to 55%. The left ventricle has low normal function. The left ventricle has no regional wall motion abnormalities. The left ventricular internal cavity size was normal in size. There is no left ventricular hypertrophy. Left ventricular diastolic parameters are consistent with Grade II diastolic dysfunction (pseudonormalization). Right Ventricle: The right ventricular size is mildly enlarged. Mildly increased right ventricular wall thickness. Right ventricular systolic function is low normal. Left Atrium: Left atrial size was normal in size. Right Atrium: Right atrial size was normal in size. Pericardium: There is no evidence of pericardial effusion. Mitral Valve: The mitral valve is normal in structure. Trivial mitral valve regurgitation. MV peak  gradient, 4.5 mmHg. The mean mitral valve gradient is 2.0 mmHg. Tricuspid  Valve: The tricuspid valve is normal in structure. Tricuspid valve regurgitation is mild. Aortic Valve: The aortic valve is grossly normal. Aortic valve regurgitation is not visualized. Aortic valve mean gradient measures 2.0 mmHg. Aortic valve peak gradient measures 3.7 mmHg. Aortic valve area, by VTI measures 3.63 cm. Pulmonic Valve: The pulmonic valve was normal in structure. Pulmonic valve regurgitation is not visualized. Aorta: The ascending aorta was not well visualized. IAS/Shunts: No atrial level shunt detected by color flow Doppler.  LEFT VENTRICLE PLAX 2D LVIDd:         4.30 cm   Diastology LVIDs:         3.20 cm   LV e' medial:    5.87 cm/s LV PW:         1.90 cm   LV E/e' medial:  15.2 LV IVS:        1.40 cm   LV e' lateral:   11.00 cm/s LVOT diam:     2.10 cm   LV E/e' lateral: 8.1 LV SV:         53 LV SV Index:   29 LVOT Area:     3.46 cm  RIGHT VENTRICLE RV S prime:     11.70 cm/s TAPSE (M-mode): 1.3 cm LEFT ATRIUM              Index        RIGHT ATRIUM           Index LA diam:        5.00 cm  2.76 cm/m   RA Area:     24.10 cm LA Vol (A2C):   74.7 ml  41.28 ml/m  RA Volume:   62.30 ml  34.43 ml/m LA Vol (A4C):   120.0 ml 66.31 ml/m LA Biplane Vol: 105.0 ml 58.02 ml/m  AORTIC VALVE                    PULMONIC VALVE AV Area (Vmax):    3.08 cm     PV Vmax:        1.43 m/s AV Area (Vmean):   3.39 cm     PV Vmean:       89.100 cm/s AV Area (VTI):     3.63 cm     PV VTI:         0.230 m AV Vmax:           96.10 cm/s   PV Peak grad:   8.2 mmHg AV Vmean:          58.900 cm/s  PV Mean grad:   4.0 mmHg AV VTI:            0.146 m      RVOT Peak grad: 3 mmHg AV Peak Grad:      3.7 mmHg AV Mean Grad:      2.0 mmHg LVOT Vmax:         85.50 cm/s LVOT Vmean:        57.600 cm/s LVOT VTI:          0.153 m LVOT/AV VTI ratio: 1.05  AORTA Ao Root diam: 3.67 cm MITRAL VALVE               TRICUSPID VALVE MV Area (PHT): 3.79 cm    TR Peak  grad:   46.8 mmHg MV Area VTI:   2.30 cm    TR Vmax:        342.00 cm/s MV Peak grad:  4.5 mmHg MV Mean grad:  2.0 mmHg    SHUNTS MV Vmax:       1.06 m/s    Systemic VTI:  0.15 m MV Vmean:      73.1 cm/s   Systemic Diam: 2.10 cm MV Decel Time: 200 msec    Pulmonic VTI:  0.128 m MV E velocity: 89.10 cm/s MV A velocity: 51.80 cm/s MV E/A ratio:  1.72 Yolonda Kida MD Electronically signed by Yolonda Kida MD Signature Date/Time: 08/17/2021/1:48:23 PM    Final    Results for orders placed or performed during the hospital encounter of 08/17/21  Resp Panel by RT-PCR (Flu A&B, Covid) Nasopharyngeal Swab     Status: None   Collection Time: 08/17/21  5:40 AM   Specimen: Nasopharyngeal Swab; Nasopharyngeal(NP) swabs in vial transport medium  Result Value Ref Range Status   SARS Coronavirus 2 by RT PCR NEGATIVE NEGATIVE Final    Comment: (NOTE) SARS-CoV-2 target nucleic acids are NOT DETECTED.  The SARS-CoV-2 RNA is generally detectable in upper respiratory specimens during the acute phase of infection. The lowest concentration of SARS-CoV-2 viral copies this assay can detect is 138 copies/mL. A negative result does not preclude SARS-Cov-2 infection and should not be used as the sole basis for treatment or other patient management decisions. A negative result may occur with  improper specimen collection/handling, submission of specimen other than nasopharyngeal swab, presence of viral mutation(s) within the areas targeted by this assay, and inadequate number of viral copies(<138 copies/mL). A negative result must be combined with clinical observations, patient history, and epidemiological information. The expected result is Negative.  Fact Sheet for Patients:  EntrepreneurPulse.com.au  Fact Sheet for Healthcare Providers:  IncredibleEmployment.be  This test is no t yet approved or cleared by the [place] FDA and  has been authorized for detection  and/or diagnosis of SARS-CoV-2 by FDA under an Emergency Use Authorization (EUA). This EUA will remain  in effect (meaning this test can be used) for the duration of the COVID-19 declaration under Section 564(b)(1) of the Act, 21 U.S.C.section 360bbb-3(b)(1), unless the authorization is terminated  or revoked sooner.       Influenza A by PCR NEGATIVE NEGATIVE Final   Influenza B by PCR NEGATIVE NEGATIVE Final    Comment: (NOTE) The Xpert Xpress SARS-CoV-2/FLU/RSV plus assay is intended as an aid in the diagnosis of influenza from Nasopharyngeal swab specimens and should not be used as a sole basis for treatment. Nasal washings and aspirates are unacceptable for Xpert Xpress SARS-CoV-2/FLU/RSV testing.  Fact Sheet for Patients: EntrepreneurPulse.com.au  Fact Sheet for Healthcare Providers: IncredibleEmployment.be  This test is not yet approved or cleared by the [place] FDA and has been authorized for detection and/or diagnosis of SARS-CoV-2 by FDA under an Emergency Use Authorization (EUA). This EUA will remain in effect (meaning this test can be used) for the duration of the COVID-19 declaration under Section 564(b)(1) of the Act, 21 U.S.C. section 360bbb-3(b)(1), unless the authorization is terminated or revoked.  Performed at [facility], [address] [place], [place] [address]   Urine Culture     Status: Abnormal (Preliminary result)   Collection Time: 08/21/21 12:00 PM   Specimen: Urine, Catheterized  Result Value Ref Range Status   Specimen Description   Final    URINE, CATHETERIZED Performed at [facility], [address] [place], [place] [address]    Special Requests   Final    NONE Performed at [facility]  [facility], [address] [place], [place] [address]    Culture MULTIPLE SPECIES PRESENT, SUGGEST RECOLLECTION (A)  Final   Report Status PENDING  Incomplete  Resp Panel by RT-PCR (Flu A&B,  Covid) Nasopharyngeal Swab     Status: None   Collection Time: 08/21/21 12:00 PM   Specimen: Nasopharyngeal Swab; Nasopharyngeal(NP) swabs in vial transport medium  Result Value Ref Range Status   SARS Coronavirus 2 by RT PCR NEGATIVE NEGATIVE Final    Comment: (NOTE) SARS-CoV-2 target nucleic acids are NOT DETECTED.  The SARS-CoV-2 RNA is generally detectable in upper respiratory specimens during the acute phase of infection. The lowest concentration of SARS-CoV-2 viral copies this assay can detect is 138 copies/mL. A negative result does not preclude SARS-Cov-2 infection and should not be used as the sole basis for treatment or other patient management decisions. A negative result may occur with  improper specimen collection/handling, submission of specimen other than nasopharyngeal swab, presence of viral mutation(s) within the areas targeted by this assay, and inadequate number of viral copies(<138 copies/mL). A negative result must be combined with clinical observations, patient history, and epidemiological information. The expected result is Negative.  Fact Sheet for Patients:  EntrepreneurPulse.com.au  Fact Sheet for Healthcare Providers:  IncredibleEmployment.be  This test is no t yet approved or cleared by the [place] FDA and  has been authorized for detection and/or diagnosis of SARS-CoV-2 by FDA under an Emergency Use Authorization (EUA). This EUA will remain  in effect (meaning this test can be used) for the duration of the COVID-19 declaration under Section 564(b)(1) of the Act, 21 U.S.C.section 360bbb-3(b)(1), unless the authorization is terminated  or revoked sooner.       Influenza A by PCR NEGATIVE NEGATIVE Final   Influenza B by PCR NEGATIVE NEGATIVE Final    Comment: (NOTE) The Xpert Xpress SARS-CoV-2/FLU/RSV plus assay is intended as an aid in the diagnosis of influenza from Nasopharyngeal swab specimens and should  not be used as a sole basis for treatment. Nasal washings and aspirates are unacceptable for Xpert Xpress SARS-CoV-2/FLU/RSV testing.  Fact Sheet for Patients: EntrepreneurPulse.com.au  Fact Sheet for Healthcare Providers: IncredibleEmployment.be  This test is not yet approved or cleared by the [place] FDA and has been authorized for detection and/or diagnosis of SARS-CoV-2 by FDA under an Emergency Use Authorization (EUA). This EUA will remain in effect (meaning this test can be used) for the duration of the COVID-19 declaration under Section 564(b)(1) of the Act, 21 U.S.C. section 360bbb-3(b)(1), unless the authorization is terminated or revoked.  Performed at [facility], [address] [place], [place] [address]     Signed:  Kristopher Oppenheim DO.  [facility] 08/22/2021, 12:21 PM

## 2021-08-22 NOTE — Progress Notes (Signed)
Progress Note   Patient: James Mata [id number] DOB: 09/29/1930 DOA: 08/17/2021     5 DOS: the patient was seen and examined on 08/22/2021   Brief hospital course: 88 year old male admitted to the hospital for evaluation of gross hematuria and acute CHF  Assessment and Plan * Acute exacerbation of CHF (congestive heart failure) ([place]) POA, pt had  significant lower extremity swelling and JVD on admission.   No prior echocardiogram on file. BNP 213.4 Echo on 1/25 showed EF 50 to [id number], grade 2 diastolic dysfunction, no LV regional wall motion abnormalities Current home dose Lasix 80 mg twice daily. 1/28: Weaning down oxygen, this morning on 1 L/min  Net IO Since Admission: -2,037 mL [08/21/21 1701] (I/O's charted appear inaccurate) Weight is down over 6 kg if accurate. Status post IV Lasix, initially 20>> 40 mg IV twice daily --Transition Lasix to home p.o. 80 mg twice daily --Continue metoprolol and nitrates --I/O's and daily wts --Monitor renal function and electrolytes  1/29: appears euvolemic. BNP 183. Admission BNP 213. On po lasix. Will try and wean off supplemental O2. Pt lives with dtr and son-in-law.  Hematuria, gross- (present on admission) Resolved.  Likely due to urethral trauma with Foley exchange day before presenting. --Mgmt per urology  1/29: resolved  Fever Patient had fever 100.4 at 11:30 PM last night.  Chest x-ray without signs of pneumonia.  Admission urine culture had never been sent, re-ordered and pending.  Patient reports feeling like he may have a mild "cold".  Retested and negative for influenza and COVID-19 today. No GI or systemic symptoms. -- Hold off antibiotics for now --monitor clinically -- Follow-up urine culture  1/29: no more fevers. Awaiting urine culture. Not on abx.  CAD (coronary artery disease)- (present on admission) Continue nitrates and beta-blockers Aspirin is on hold due to gross hematuria.  Resume aspirin tomorrow if still  no bleeding  1/29: restart ASA at discharge.  Unspecified atrial fibrillation ([place])- (present on admission) Rate controlled Continue metoprolol CHA2DS2-VASc of 4 and ideally will require long-term anticoagulation. Hold off on anticoagulation for now due to gross hematuria.  Consider resuming tomorrow if still no bleeding.  1/29: can restart ASA at discharge.  Hypertension- (present on admission) BP overall stable. --Continue metoprolol  BPH with obstruction/lower urinary tract symptoms- (present on admission) Pt has chronic indwelling Foley. Having obstructive symptoms after Foley was exchanged 1 day prior to this admission.  Presented to the ER with obstructive urinary symptoms and gross hematuria --Urology following, see their recs --CBI discontinued per urology --Irrigate Foley as needed --Continue finasteride, doxazosin, oxybutynin as needed for bladder spasms substituted for Enablex while inpatient --Hold Aspirin --No plan or indication for inpatient procedure at this time --Discharge with three-way catheter, capped inflow -- Outpatient follow-up in 1 to 2 weeks with Dr. Caprice Beaver for voiding trial and discussion of HOLEP   Subjective: stable. No fevers. Wants to go home. Still on 1 L/min supplemental O2. Asked his bedside RN to ambulate him and attempt to wean off O2. Pt states he lives at home with dtr and son-in-law. He relates that he was mowing his own grass with a push lawn mover(no self propelled wheels) until age 66 yo. Stopped mowing his lawn due to his prostate.  Objective BP (!) 105/51 (BP Location: Left Arm)    Pulse (!) 54    Temp 98.4 F (36.9 C)    Resp 18    Ht 5\' 7"  (1.702 m)    Wt 65.2 kg  SpO2 91%    BMI 22.52 kg/m   Physical Exam Vitals and nursing note reviewed.  Constitutional:      General: He is not in acute distress.    Appearance: He is not ill-appearing, toxic-appearing or diaphoretic.  HENT:     Head: Normocephalic and atraumatic.  Neck:      Comments: +cervical kyphosis Cardiovascular:     Rate and Rhythm: Normal rate and regular rhythm.  Pulmonary:     Effort: No respiratory distress.     Comments: Coarse BS bilaterally. No wheezing Abdominal:     General: Abdomen is flat. Bowel sounds are normal. There is no distension.     Tenderness: There is no abdominal tenderness.  Musculoskeletal:     Comments: Trace pedal edema  Skin:    General: Skin is warm and dry.     Capillary Refill: Capillary refill takes less than 2 seconds.  Neurological:     General: No focal deficit present.     Mental Status: He is alert and oriented to person, place, and time.     Data Reviewed:  BNP (last 3 results) Recent Labs    08/18/21 0728 08/22/21 0524  BNP 213.4* 183.1*    ProBNP (last 3 results) No results for input(s): PROBNP in the last 8760 hours.  BMP Latest Ref Rng & Units 08/22/2021 08/21/2021 08/20/2021  Glucose 70 - 99 mg/dL 116(H) 124(H) 104(H)  BUN 8 - 23 mg/dL 32(H) 28(H) 21  Creatinine 0.61 - 1.24 mg/dL 1.39(H) 1.27(H) 1.14  BUN/Creat Ratio 10 - 24 - - -  Sodium 135 - 145 mmol/L 137 136 138  Potassium 3.5 - 5.1 mmol/L 4.3 4.4 4.0  Chloride 98 - 111 mmol/L 102 101 106  CO2 22 - 32 mmol/L 26 26 27   Calcium 8.9 - 10.3 mg/dL 8.8(L) 8.9 8.7(L)   CBC Latest Ref Rng & Units 08/22/2021 08/21/2021 08/20/2021  WBC 4.0 - 10.5 K/uL 10.7(H) 10.2 10.1  Hemoglobin 13.0 - 17.0 g/dL 11.0(L) 10.4(L) 11.0(L)  Hematocrit 39.0 - 52.0 % 32.0(L) 31.0(L) 32.7(L)  Platelets 150 - 400 K/uL 160 142(L) 136(L)     Family Communication: no family at bedside  Disposition: return home. Status is: Inpatient  Remains inpatient appropriate because: attempting to wean O2. Pt does not use home supplemental O2   Time spent: 35 minutes  Author: Kristopher Oppenheim, DO 08/22/2021 11:14 AM  For on call review [email or web address].

## 2021-08-25 NOTE — Progress Notes (Signed)
Acute on Chronic Systolic and Diastolic Heart Failure. Pt had previously had systolic heart failure that had improved when taking entresto. 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

## 2021-08-26 ENCOUNTER — Ambulatory Visit: Payer: Medicare PPO | Admitting: Family

## 2021-08-29 ENCOUNTER — Emergency Department
Admission: EM | Admit: 2021-08-29 | Discharge: 2021-08-30 | Disposition: A | Discharge status: Home / Self Care | Payer: Medicare PPO | Attending: Emergency Medicine | Admitting: Emergency Medicine

## 2021-08-29 ENCOUNTER — Other Ambulatory Visit: Payer: Self-pay

## 2021-08-29 DIAGNOSIS — T83091A Other mechanical complication of indwelling urethral catheter, initial encounter: Secondary | ICD-10-CM

## 2021-08-29 DIAGNOSIS — Z9189 Other specified personal risk factors, not elsewhere classified: Secondary | ICD-10-CM

## 2021-08-29 DIAGNOSIS — R339 Retention of urine, unspecified: Secondary | ICD-10-CM | POA: Diagnosis present

## 2021-08-29 DIAGNOSIS — T83511A Infection and inflammatory reaction due to indwelling urethral catheter, initial encounter: Secondary | ICD-10-CM | POA: Diagnosis not present

## 2021-08-29 DIAGNOSIS — T839XXA Unspecified complication of genitourinary prosthetic device, implant and graft, initial encounter: Secondary | ICD-10-CM

## 2021-08-29 LAB — URINALYSIS, ROUTINE W REFLEX MICROSCOPIC
Bilirubin Urine: NEGATIVE
Glucose, UA: NEGATIVE mg/dL
Ketones, ur: NEGATIVE mg/dL
Nitrite: POSITIVE — AB
Protein, ur: 30 mg/dL — AB
Specific Gravity, Urine: 1.015 (ref 1.005–1.030)
Squamous Epithelial / HPF: NONE SEEN (ref 0–5)
WBC, UA: >50 WBC/hpf — ABNORMAL HIGH (ref 0–5)
pH: 7 (ref 5.0–8.0)

## 2021-08-29 MED ORDER — CEPHALEXIN 500 MG PO CAPS: 500.0000 mg | ORAL_CAPSULE | Freq: Three times a day (TID) | ORAL | 0 refills | Status: AC

## 2021-08-29 MED ORDER — LIDOCAINE HCL URETHRAL/MUCOSAL 2 % EX GEL
1.0000 "application " | Freq: Once | CUTANEOUS | Status: AC
  Administered 2021-08-29: 1 via URETHRAL
  Filled 2021-08-29: qty 10

## 2021-08-29 MED ORDER — CEPHALEXIN 500 MG PO CAPS
500.0000 mg | ORAL_CAPSULE | Freq: Once | ORAL | Status: AC
  Administered 2021-08-29: 500 mg via ORAL
  Filled 2021-08-29: qty 1

## 2021-08-29 NOTE — ED Triage Notes (Signed)
Arrived via POV with son-in-law. Reports foley catheter has not been draining since this morning. Reports urine leaking around tube at meatus. Denies sensation of full bladder. Family member states they attempted to irrigate unsuccessfully.

## 2021-08-29 NOTE — ED Provider Notes (Signed)
[facility] Provider Note    Event Date/Time   First MD Initiated Contact with Patient 08/29/21 2100     (approximate)   History   Urinary Retention (Foley Catheter not draining)   HPI  James Mata is a 88 y.o. male who presents to the ED for evaluation of Urinary Retention (Foley Catheter not draining)   I reviewed recent medical admission, DC summary on 1/29.  History of CHF and BPH.  He required indwelling three-way Foley catheter and continuous bladder irrigation for a few days during this recent admission.  Patient presents to the ED, accompanied by his son, for evaluation of nondraining urinary catheter.  He reports feeling fine without pain, nausea, emesis, fever.  Denies hematuria to the catheter, but has had increased sedimentation over the past couple days.  He reports that when he was discharged his three-way Foley catheter, the third port (for irrigation) was never capped and edges been open and leaking urine onto him.  Physical Exam   Triage Vital Signs: ED Triage Vitals  Enc Vitals Group     BP 08/29/21 2026 140/70     Pulse Rate 08/29/21 2026 77     Resp 08/29/21 2026 16     Temp 08/29/21 2026 97.8 F (36.6 C)     Temp Source 08/29/21 2026 Oral     SpO2 08/29/21 2026 95 %     Weight --      Height --      Head Circumference --      Peak Flow --      Pain Score 08/29/21 2027 0     Pain Loc --      Pain Edu? --      Excl. in [place]? --     Most recent vital signs: Vitals:   08/29/21 2130 08/29/21 2145  BP: 123/68   Pulse: (!) 51   Resp: 20   Temp:    SpO2:  93%    General: Awake, no distress.  CV:  Good peripheral perfusion.  Resp:  Normal effort.  Abd:  No distention.  Soft and benign throughout.  Minimal suprapubic tenderness without guarding or peritoneal features. MSK:  No deformity noted.  Neuro:  No focal deficits appreciated. Other:     ED Results / Procedures / Treatments   Labs (all labs ordered are  listed, but only abnormal results are displayed) Labs Reviewed  URINALYSIS, ROUTINE W REFLEX MICROSCOPIC - Abnormal; Notable for the following components:      Result Value   Hgb urine dipstick MODERATE (*)    Protein, ur 30 (*)    Nitrite POSITIVE (*)    Leukocytes,Ua LARGE (*)    WBC, UA >50 (*)    Bacteria, UA RARE (*)    All other components within normal limits  URINE CULTURE    EKG   RADIOLOGY   Official radiology report(s): No results found.  PROCEDURES and INTERVENTIONS:  Procedures  Medications  cephALEXin (KEFLEX) capsule 500 mg (has no administration in time range)  lidocaine (XYLOCAINE) 2 % jelly 1 application (1 application Urethral Given 08/29/21 2154)     IMPRESSION / MDM / ASSESSMENT AND PLAN / ED COURSE  I reviewed the triage vital signs and the nursing notes.  Pleasant 88 year old male presents to the ED with nondraining Foley catheter, likely obstructive in the setting of increased sediment in the setting of a UTI, ultimately suitable for outpatient management.  He looks clinically well and has  normal vital signs on room air.  No significant abdominal pain or tenderness.  No flank pain, fever, nausea or emesis to suggest acute pyelonephritis or systemic symptoms.  Replaced his Foley catheter, and UA from this demonstrates what appears to be dirty urine that we will send for culture and get him started on 10 days of Keflex to treat catheter-associated UTI.  I am concerned that his UTI is from his third reports not being capped at recent discharge.  We provided him an appropriate cap for this and discussed appropriate management of his catheter.  Return precautions for the ED discussed.  Clinical Course as of 08/29/21 2311  Nancy Fetter Aug 29, 2021  2147 RN successfully replaced 24Fr 3-way. Draining well [DS]  [id number] Reassessed.  We discussed urinalysis results and infection. [DS]    Clinical Course User Index [DS] Vladimir Crofts, MD     FINAL CLINICAL IMPRESSION(S)  / ED DIAGNOSES   Final diagnoses:  Urinary retention  Problem with Foley catheter, initial encounter ([place])  Obstruction of Foley catheter, initial encounter ([place])  At risk for UTI related to indwelling catheter     Rx / DC Orders   ED Discharge Orders          Ordered    cephALEXin (KEFLEX) 500 MG capsule  3 times daily        08/29/21 2242             Note:  This document was prepared using Dragon voice recognition software and may include unintentional dictation errors.   Vladimir Crofts, MD 08/29/21 [phone]

## 2021-08-29 NOTE — Discharge Instructions (Signed)
Use Tylenol for pain and fevers.  Up to 1000 mg per dose, up to 4 times per day.  Do not take more than 4000 mg of Tylenol/acetaminophen within 24 hours..  You are being discharged with a 10-day course of Keflex antibiotic to take 3 times daily to treat a UTI.  Please finish all 30 pills, even if your urine is clearing up.  Return to the ED with any worsening symptoms despite this.

## 2021-08-31 LAB — URINE CULTURE

## 2021-09-01 ENCOUNTER — Ambulatory Visit: Payer: Medicare PPO | Admitting: Urology

## 2021-09-01 ENCOUNTER — Encounter: Payer: Self-pay | Admitting: Urology

## 2021-09-01 ENCOUNTER — Other Ambulatory Visit: Payer: Self-pay

## 2021-09-01 VITALS — BP 121/67 | HR 69

## 2021-09-01 DIAGNOSIS — R310 Gross hematuria: Secondary | ICD-10-CM | POA: Diagnosis not present

## 2021-09-01 DIAGNOSIS — N138 Other obstructive and reflux uropathy: Secondary | ICD-10-CM

## 2021-09-01 DIAGNOSIS — N401 Enlarged prostate with lower urinary tract symptoms: Secondary | ICD-10-CM | POA: Diagnosis not present

## 2021-09-01 NOTE — Progress Notes (Signed)
° °  09/01/2021 °9:24 AM  ° °James Mata °05/31/1931 °[id number] ° °Reason for visit: BPH and Foley dependent urinary retention, gross hematuria ° °HPI: °Very comorbid frail 88-year-old male with long history of BPH(prostate 160 g )with Foley dependent urinary retention over the last year.  He has been followed primarily by Dr. Stoioff in our [facility].  He was recently hospitalized for 1 week at the end of January 2023 with gross hematuria which ultimately resolved with traction and CBI.  He then had another ER visit for poorly draining catheter felt to be secondary to sediment on 08/29/2021. ° °Suspect BPH as the etiology of his recurrent gross hematuria with catheter in place.  We again had a long conversation about options including chronic indwelling Foley with monthly changes, suprapubic tube, or outlet procedure with HOLEP.  I think the benefit of HOLEP would be better than 90% chance of resuming spontaneous voiding, with likely resolution of his recurrent gross hematuria secondary to BPH.  Cystoscopy by Dr. Stoioff previously showed no evidence of bladder lesions and large friable prostate. ° °We discussed the risks and benefits of HoLEP at length.  The procedure requires general anesthesia and takes 1 to 2 hours, and a holmium laser is used to enucleate the prostate and push this tissue into the bladder.  A morcellator is then used to remove this tissue, which is sent for pathology.  The vast majority(>95%) of patients are able to discharge the same day with a catheter in place for 2 to 3 days, and will follow-up in clinic for a voiding trial.  We specifically discussed the risks of bleeding, infection, retrograde ejaculation, temporary urgency and urge incontinence, very low risk of long-term incontinence, urethral stricture/bladder neck contracture, pathologic evaluation of prostate tissue and possible detection of prostate cancer or other malignancy, and possible need for additional  procedures. ° °He and his family are leaning toward HOLEP for outlet procedure to address both his Foley dependent urinary retention and recurrent gross hematuria.  Unfortunately, I think pursuing a suprapubic tube would be high risk for recurrent gross hematuria as well as sediment causing drainage problems that have resulted in numerous ER visits over the last year.  He is also seeing his PCP tomorrow, and is planning to discuss surgery with him as well regarding his other comorbidities. ° °-We will call to schedule HOLEP after patient has met with PCP °-If he ultimately defers HOLEP, can consider monthly Foley changes or suprapubic tube, but very high risk for recurrent gross hematuria and sediment blockage ° °I spent 45 total minutes on the day of the encounter including pre-visit review of the medical record, face-to-face time with the patient, and post visit ordering of labs/imaging/tests. ° °Brian C Sninsky, MD ° °[facility] °[address], [address] °[place], [place] [address] °[phone] ° ° °

## 2021-09-01 NOTE — Patient Instructions (Signed)

## 2021-09-27 ENCOUNTER — Ambulatory Visit: Payer: Medicare PPO

## 2021-09-27 ENCOUNTER — Other Ambulatory Visit: Payer: Self-pay

## 2021-09-27 DIAGNOSIS — R339 Retention of urine, unspecified: Secondary | ICD-10-CM

## 2021-09-27 NOTE — Progress Notes (Signed)
Cath Change/ Replacement ? ?Patient is present today for a catheter change due to urinary retention.  72mL of water was removed from the balloon, a 24FR foley cath was removed with out difficulty.  Patient was cleaned and prepped in a sterile fashion with betadine. A 22 FR coude foley cath was replaced into the bladder, no complications were noted. Urine return not initially noted, catheter irrigated, 184mL of urine return noted. The balloon was filled with 76ml of sterile water. A night bag was attached for drainage. Patient was given proper instruction on catheter care.   ? ?Performed by: Gordy Clement CMA, & Bradly Bienenstock CMA ? ?Follow up: RTC in 1 month for cath exchange.  ?

## 2021-09-30 ENCOUNTER — Telehealth: Payer: Self-pay | Admitting: *Deleted

## 2021-09-30 MED ORDER — GEMTESA 75 MG PO TABS: 75.0000 mg | ORAL_TABLET | Freq: Every day | ORAL | 0 refills | Status: DC

## 2021-09-30 NOTE — Telephone Encounter (Signed)
Patient daughter called in and states when her dad stand up he leaks out of his penis. I talked with shannon , she states they can come by the office and pick up gemtesa samples. Samples placed up front  ?

## 2021-10-04 ENCOUNTER — Telehealth: Payer: Self-pay

## 2021-10-04 NOTE — Telephone Encounter (Signed)
Incoming call from pt's daughter who questions if pt should stay on trospium in addition to gemtesa or should d/c trsopium for gemtesa. Please advise.  ?

## 2021-10-04 NOTE — Telephone Encounter (Signed)
Spoke with daughter and advised results. ?Pt will stop trospium. ?

## 2021-10-27 ENCOUNTER — Ambulatory Visit: Payer: Medicare PPO | Admitting: Urology

## 2021-10-28 ENCOUNTER — Telehealth: Payer: Self-pay

## 2021-10-28 ENCOUNTER — Encounter: Payer: Self-pay | Admitting: Physician Assistant

## 2021-10-28 ENCOUNTER — Ambulatory Visit: Payer: Medicare PPO | Admitting: Physician Assistant

## 2021-10-28 DIAGNOSIS — N3289 Other specified disorders of bladder: Secondary | ICD-10-CM

## 2021-10-28 DIAGNOSIS — R339 Retention of urine, unspecified: Secondary | ICD-10-CM | POA: Diagnosis not present

## 2021-10-28 DIAGNOSIS — N401 Enlarged prostate with lower urinary tract symptoms: Secondary | ICD-10-CM

## 2021-10-28 DIAGNOSIS — Z436 Encounter for attention to other artificial openings of urinary tract: Secondary | ICD-10-CM

## 2021-10-28 MED ORDER — FINASTERIDE 5 MG PO TABS: 5.0000 mg | ORAL_TABLET | Freq: Every day | ORAL | 3 refills | Status: DC

## 2021-10-28 MED ORDER — TROSPIUM CHLORIDE 20 MG PO TABS: 20.0000 mg | ORAL_TABLET | Freq: Every day | ORAL | 11 refills | Status: DC

## 2021-10-28 NOTE — Progress Notes (Signed)
Cath Change/ Replacement ? ?Patient is present today for a catheter change due to urinary retention.  79ml of water was removed from the balloon, a 22FR coude foley cath was removed without difficulty.  Patient was cleaned and prepped in a sterile fashion with betadine and 2% lidocaine jelly was instilled into the urethra. A 22 FR coude foley cath was replaced into the bladder no complications were noted Urine return was noted and urine was yellow in color. The balloon was filled with 23ml of sterile water. A night bag was attached for drainage.  Patient tolerated well.   ? ?Performed by: Carman Ching, PA-C  ? ?Additional notes: Patient does not wish to pursue HOLEP, SPT, or another voiding trial. He wishes to continue with monthly Foley exchanges in clinic. ? ?Leslye Peer is too expensive; he wishes to resume trospium. Rx sent today. ? ?Follow up: Return in about 4 weeks (around 11/25/2021) for Catheter exchange.   ?

## 2021-10-28 NOTE — Telephone Encounter (Signed)
Incoming call on triage line from pts daughter in regards to pts finasteride Rx. She is requesting finasteride Rx be sent to [facility] so the pt may receive it for free. She notes pt has been paying for Rx until now. Finasteride Rx sent to [facility].  ?

## 2021-11-30 ENCOUNTER — Ambulatory Visit: Payer: Medicare PPO | Admitting: Physician Assistant

## 2021-11-30 DIAGNOSIS — R339 Retention of urine, unspecified: Secondary | ICD-10-CM | POA: Diagnosis not present

## 2021-11-30 DIAGNOSIS — Z435 Encounter for attention to cystostomy: Secondary | ICD-10-CM

## 2021-11-30 MED ORDER — SULFAMETHOXAZOLE-TRIMETHOPRIM 800-160 MG PO TABS: 1.0000 | ORAL_TABLET | Freq: Two times a day (BID) | ORAL | 0 refills | Status: AC

## 2021-11-30 NOTE — Progress Notes (Signed)
Cath Change/ Replacement ? ?Patient is present today for a catheter change due to urinary retention.  52ml of water was removed from the balloon, a 22FR silicone foley cath was removed without difficulty.  Patient was cleaned and prepped in a sterile fashion with betadine and 2% lidocaine jelly was instilled into the urethra . A 22 FR silicone coude foley cath was replaced into the bladder no complications were noted Urine return was noted and urine was pink in color. The balloon was filled with 51ml of sterile water. A night bag was attached for drainage.  Patient tolerated well.   ? ?Performed by: Carman Ching, PA-C  ? ?Additional notes: Patient reports catheter occluded late yesterday and home health attempted replacement. They were unable to advance the catheter to his bladder, but for unclear reasons hyperinflated his Foley balloon despite this. He had significant gross hematuria with visible clots in the tubing upon presentation consistent with urethral trauma. Foley catheter was exchanged easily in clinic today and gross hematuria was significantly improved with this; no additional clots were visualized in the urine with insertion of the new catheter. I am prescribing 3 days of empiric Bactrim DS BID for UTI prevention given urethral trauma as above. ? ?Follow up: Return in about 4 weeks (around 12/28/2021) for Catheter exchange.   ?

## 2021-12-23 ENCOUNTER — Other Ambulatory Visit: Payer: Self-pay

## 2021-12-23 ENCOUNTER — Encounter: Payer: Self-pay | Admitting: *Deleted

## 2021-12-23 ENCOUNTER — Ambulatory Visit: Payer: Medicare PPO | Admitting: Physician Assistant

## 2021-12-23 ENCOUNTER — Emergency Department
Admission: EM | Admit: 2021-12-23 | Discharge: 2021-12-23 | Disposition: A | Discharge status: Home / Self Care | Payer: Medicare PPO | Attending: Emergency Medicine | Admitting: Emergency Medicine

## 2021-12-23 DIAGNOSIS — Y69 Unspecified misadventure during surgical and medical care: Secondary | ICD-10-CM | POA: Diagnosis not present

## 2021-12-23 DIAGNOSIS — I110 Hypertensive heart disease with heart failure: Secondary | ICD-10-CM | POA: Diagnosis not present

## 2021-12-23 DIAGNOSIS — I509 Heart failure, unspecified: Secondary | ICD-10-CM | POA: Insufficient documentation

## 2021-12-23 DIAGNOSIS — T83028A Displacement of other indwelling urethral catheter, initial encounter: Secondary | ICD-10-CM | POA: Insufficient documentation

## 2021-12-23 DIAGNOSIS — T83198A Other mechanical complication of other urinary devices and implants, initial encounter: Secondary | ICD-10-CM

## 2021-12-23 DIAGNOSIS — R338 Other retention of urine: Secondary | ICD-10-CM | POA: Diagnosis not present

## 2021-12-23 DIAGNOSIS — T83021A Displacement of indwelling urethral catheter, initial encounter: Secondary | ICD-10-CM

## 2021-12-23 NOTE — ED Triage Notes (Signed)
Pt had a foley cath inserted today at [facility].  Catheter fell out this afternoon after the balloon burst per pt.  Pt alert.

## 2021-12-23 NOTE — ED Notes (Signed)
Leg bag applied using aseptic technique. Education provided on catheter care. Instructed to switch to larger collection bag at night. Patient verbalized understanding.

## 2021-12-23 NOTE — Progress Notes (Signed)
Cath Change/ Replacement  Patient is present today for a catheter change due to urinary retention.  54ml of water was removed from the balloon, a 22FR coud foley cath was removed without difficulty.  Patient was cleaned and prepped in a sterile fashion with betadine and 2% lidocaine jelly was instilled into the urethra. A 18 FR silicone coud foley cath was replaced into the bladder no complications were noted Urine return was noted 341ml and urine was yellow in color. The balloon was filled with 59ml of sterile water. A night bag was attached for drainage.  Patient tolerated well.    Performed by: Debroah Loop, PA-C   Additional notes: Catheter stopped draining within the past 24 hours; seen urgently. Will increase frequency of catheter changes to every 3 weeks to attempt to avoid catheter occlusion episodes.  Follow up: Return in about 3 weeks (around 01/13/2022) for Catheter exchange.

## 2021-12-23 NOTE — ED Provider Notes (Signed)
[facility] Provider Note    Event Date/Time   First MD Initiated Contact with Patient 12/23/21 2041     (approximate)   History   catheter change   HPI  James Mata is a 88 y.o. male with history of BPH with obstruction and urinary retention presents to the emergency department for replacement of his Foley catheter.  The catheter was changed in the urology office today but the balloon burst and the catheter came out.   Past Medical History:  Diagnosis Date   CHF (congestive heart failure) ([place])    Complication of anesthesia    had trouble waking up in 2014   Enlarged prostate    Hyperlipidemia    Hypertension     Current Outpatient Rx   Order #: [id number] Class: Historical Med   Order #: [id number] Class: Historical Med   Order #: [id number] Class: Historical Med   Order #: [id number] Class: Historical Med   Order #: [id number] Class: Normal   Order #: [id number] Class: Historical Med   Order #: [id number] Class: Historical Med   Order #: [id number] Class: Historical Med   Order #: [id number] Class: Historical Med   Order #: [id number] Class: Historical Med   Order #: [id number] Class: Historical Med   Order #: [id number] Class: Normal        Physical Exam   Triage Vital Signs: ED Triage Vitals [12/23/21 2010]  Enc Vitals Group     BP      Pulse      Resp      Temp      Temp src      SpO2      Weight 143 lb (64.9 kg)     Height 5\' 7"  (1.702 m)     Head Circumference      Peak Flow      Pain Score 0     Pain Loc      Pain Edu?      Excl. in [place]?     Most recent vital signs: There were no vitals filed for this visit.  General: Awake, no distress.  CV:  Good peripheral perfusion.  Resp:  Normal effort.  Abd:  No distention.  Other:     ED Results / Procedures / Treatments   Labs (all labs ordered are listed, but only abnormal results are displayed) Labs Reviewed - No data to display   EKG     RADIOLOGY    I have  independently reviewed and interpreted imaging as well as reviewed report from radiology.  PROCEDURES:  Critical Care performed: No  Procedures   MEDICATIONS ORDERED IN ED:  Medications - No data to display   IMPRESSION / MDM / [place] / ED COURSE   I reviewed the triage vital signs and the nursing notes.                              Differential diagnosis includes, but is not limited to: Urinary retention, Foley catheter dislodgment  Patient's presentation is most consistent with acute, uncomplicated illness.  88 year old male presenting to the emergency department for replacement of his Foley catheter after it dislodged when the balloon burst.  See HPI for further details.  Patient has been able to urinate small amounts on his own and does not feel like that he is retaining.  83 French coud inserted by nursing staff.  Catheter is draining pale yellow urine.  Patient denies complaints and will be  discharged home.  He was encouraged to follow-up here with urology as scheduled or sooner for complaints.     FINAL CLINICAL IMPRESSION(S) / ED DIAGNOSES   Final diagnoses:  Displacement of Foley catheter, initial encounter ([place])     Rx / DC Orders   ED Discharge Orders     None        Note:  This document was prepared using Dragon voice recognition software and may include unintentional dictation errors.   Victorino Dike, FNP 12/23/21 2205    Carrie Mew, MD 12/24/21 1550

## 2021-12-24 ENCOUNTER — Ambulatory Visit (INDEPENDENT_AMBULATORY_CARE_PROVIDER_SITE_OTHER): Payer: Medicare PPO | Admitting: Physician Assistant

## 2021-12-24 ENCOUNTER — Encounter: Payer: Self-pay | Admitting: Physician Assistant

## 2021-12-24 VITALS — BP 130/74 | HR 67 | Ht 67.0 in

## 2021-12-24 DIAGNOSIS — R310 Gross hematuria: Secondary | ICD-10-CM | POA: Diagnosis not present

## 2021-12-24 MED ORDER — SULFAMETHOXAZOLE-TRIMETHOPRIM 800-160 MG PO TABS: 1.0000 | ORAL_TABLET | Freq: Two times a day (BID) | ORAL | 0 refills | Status: AC

## 2021-12-24 NOTE — Progress Notes (Signed)
Cath Change/ Replacement  Patient is present today for a catheter change due to gross hematuria.  79ml of water was removed from the balloon, a 16FR coude foley cath was removed with out difficulty.  Patient was cleaned and prepped in a sterile fashion with betadine and 2% lidocaine jelly was instilled into the urethra. A 22 FR coude foley cath was replaced into the bladder no complications were noted Urine return was noted 7ml and urine was pink tinged in color. The balloon was filled with 77ml of sterile water. A night bag was attached for drainage.  Patient tolerated well.    Performed by: Carman Ching, PA-C   Additional notes: Patient added on to clinic this morning after his Foley catheter that I placed yesterday fell out on its own after he left clinic.  He was seen in the emergency department last night for Foley catheter placement.  A 16 French coud catheter was placed and he has noticed gross hematuria since.  He brings the silicone catheter replaced with him to clinic today, and it appears that the balloon ruptured. Gross hematuria noted in his drainage bag; no clots visualized.  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  I initially attempted to place an 58 [place] coud catheter, however this was nondraining and due to concerns for misplacement, I elected to exchange this for a 22 [place] coud catheter as above.  Gross hematuria was noted to significantly improve with placement of the new catheter.  I am treating him with 3 days of Bactrim DS twice daily for UTI prevention given the degree of manipulation he has had in the last day.  He states he was able to void a little bit after his catheter fell out yesterday and so he would like to reattempt a voiding trial.  We will schedule this for his next catheter change per patient preference.  If  he fails this, I recommend consideration of suprapubic catheter given how challenging his prostate is.  Follow up: Return in about 3 weeks (around 01/14/2022) for Voiding trial.

## 2022-01-03 ENCOUNTER — Ambulatory Visit: Payer: Medicare PPO | Admitting: Physician Assistant

## 2022-01-11 ENCOUNTER — Ambulatory Visit: Payer: Medicare PPO | Admitting: Physician Assistant

## 2022-01-11 DIAGNOSIS — Z466 Encounter for fitting and adjustment of urinary device: Secondary | ICD-10-CM

## 2022-01-11 DIAGNOSIS — R338 Other retention of urine: Secondary | ICD-10-CM | POA: Diagnosis not present

## 2022-01-11 NOTE — Patient Instructions (Signed)
I would like you to consider switching from the catheter in your penis to a suprapubic catheter. This catheter would still drain your bladder, but it would do so from your lower belly instead of through your penis. A diagram of this is below.    If you wanted a suprapubic catheter, I would get your set up with our Interventional Radiology team to place one for you. This would require a procedure, not a surgery, to insert the catheter through your belly and into your bladder. You would see Interventional Radiology a couple of times for your first few catheter changes, then you would come back to see me every month for your scheduled catheter changes.  I think the suprapubic catheter would be a good choice for you because of how frequently your catheter gets clogged. The staff in the ED tends to have a hard time replacing your catheters, which has caused a lot of bleeding recently. I'm worried about the health of your urethra long-term with these poorly placed catheters. We could bypass your urethra entirely with a suprapubic catheter, which I think would be a good choice for you. I do not think that switching to a suprapubic catheter would reduce your risk for urinary tract infections or catheter encrustation.

## 2022-01-11 NOTE — Progress Notes (Signed)
Cath Change/ Replacement  Patient is present today for a catheter change due to urinary retention.  73ml of water was removed from the balloon, a 22FR coude foley cath was removed without difficulty.  Patient was cleaned and prepped in a sterile fashion with betadine and 2% lidocaine jelly was instilled into the urethra. A 20 FR coude Council tip foley cath was replaced into the bladder no complications were noted Urine return was noted and urine was yellow in color. The balloon was filled with 72ml of sterile water. A night bag was attached for drainage.  Patient tolerated well.    Performed by: Carman Ching, PA-C  Additional notes: Cannot afford Renacidin. Has decided not to proceed with voiding trial. Changed catheter as above 2 days prior to planned exchange due to reports of decreased drainage; significant encrustation noted at the Foley tip. We discussed consideration of SPT placement given multiple failed/traumatic Foley insertions in the ED when he presents overnight or on weekends with encrustation. Discussed that SPT would not reduce his risk for infection or encrustation, but would bypass his challenging urethra with the goal of avoiding further traumatic placements causing bleeding.  Follow up: Return in about 3 weeks (around 02/01/2022) for Catheter exchange.

## 2022-01-13 ENCOUNTER — Ambulatory Visit: Payer: Medicare PPO | Admitting: Physician Assistant

## 2022-01-14 ENCOUNTER — Ambulatory Visit: Payer: Medicare PPO | Admitting: Physician Assistant

## 2022-01-21 ENCOUNTER — Ambulatory Visit (INDEPENDENT_AMBULATORY_CARE_PROVIDER_SITE_OTHER): Payer: Medicare PPO | Admitting: Physician Assistant

## 2022-01-21 DIAGNOSIS — Z466 Encounter for fitting and adjustment of urinary device: Secondary | ICD-10-CM | POA: Diagnosis not present

## 2022-01-21 DIAGNOSIS — R8290 Unspecified abnormal findings in urine: Secondary | ICD-10-CM

## 2022-01-21 DIAGNOSIS — R338 Other retention of urine: Secondary | ICD-10-CM

## 2022-01-21 LAB — BLADDER SCAN AMB NON-IMAGING

## 2022-01-21 MED ORDER — RENACIDIN IR SOLN
30.0000 mL | Freq: Once | Status: AC
  Administered 2022-01-21: 30 mL

## 2022-01-21 NOTE — Progress Notes (Addendum)
Patient presented to clinic today with concerns of decreased Foley drainage. Most recent Foley exchange 10 days ago.  Bladder Instillation  Due to concerns for urinary encrustation of Foley catheter patient is present today for a Bladder Instillation of Renacidin. Foley catheter was detached from night bag and 1 vial of Renacidin solution was instilled into the bladder via the catheter in place. The catheter was plugged and solution was left to dwell in the bladder for 15 minutes. After 15 minutes, the catheter was unplugged and the night bag was reattached for drainage. Catheter draining fine upon completion.  Performed by: Carman Ching, PA-C and Debbe Bales, CMA  Additional notes: Bladder scan upon completion remained elevated at >235mL; proceeded with Foley exchange as below.  Cath Change/ Replacement  Patient is present today for a catheter change due to urinary retention.  89ml of water was removed from the balloon, a 20FR coude Council tip foley cath was removed without difficulty.  Patient was cleaned and prepped in a sterile fashion with betadine and 2% lidocaine jelly was instilled into the urethra. A 20 FR coude Council tip foley cath was replaced into the bladder no complications were noted Urine return was noted and urine was yellow in color. The balloon was filled with of sterile water. A night bag was attached for drainage.  Patient tolerated well.    Performed by: Carman Ching, PA-C and Debbe Bales, CMA  Additional notes: Patient wishes to proceed with SPT placement. His PCP is in agreement. Will arrange.  Follow up: Return if symptoms worsen or fail to improve.

## 2022-01-21 NOTE — Addendum Note (Signed)
Addended by: Frankey Shown on: 01/21/2022 01:53 PM   Modules accepted: Orders

## 2022-01-24 ENCOUNTER — Ambulatory Visit: Payer: Medicare PPO | Admitting: Physician Assistant

## 2022-01-28 ENCOUNTER — Telehealth: Payer: Self-pay

## 2022-01-28 DIAGNOSIS — R338 Other retention of urine: Secondary | ICD-10-CM

## 2022-01-28 NOTE — Telephone Encounter (Signed)
Patient daughter called in and would like to have patient set up for a SPT.

## 2022-01-28 NOTE — Telephone Encounter (Signed)
I spoke with IR nurse, patient is scheduled pt is scheduled for Wed 7/19 at 10a and to arrive at 9a.   Left message for pt daughter to return my call

## 2022-01-28 NOTE — Telephone Encounter (Signed)
As per sam, patient has a history of  urinary encrustation and  currently changing catheter out about every 3 weeks. She does not think he'll be able to go 4 weeks between changes/IR upsizing. It would be really great if they could place a 16Fr from the get-go so if he has issues he can come here to be swapped out without waiting to schedule with them.    Message sent to IR nurse

## 2022-01-28 NOTE — Telephone Encounter (Signed)
SPT placement already being arranged, see prior note.

## 2022-01-28 NOTE — Telephone Encounter (Signed)
Patient's daughter returned the call.    Information provided to the patient's daughter.  She expressed understanding.

## 2022-02-01 ENCOUNTER — Ambulatory Visit: Payer: Medicare PPO | Admitting: Physician Assistant

## 2022-02-03 NOTE — Progress Notes (Signed)
Patient on schedule for SPT placement 7/19 , spoke with daughter /Judy on phone with pre procedure instructions given. Will contact PCP regarding holding baby aspirin prior to procedure. Instructed to be here @ 0900, NPO after MN prior to procedure as well as driver post procedure/recovery/discharge. Stated understanding.

## 2022-02-07 ENCOUNTER — Ambulatory Visit
Admission: RE | Admit: 2022-02-07 | Discharge: 2022-02-07 | Disposition: A | Discharge status: Home / Self Care | Payer: Medicare PPO | Source: Ambulatory Visit | Attending: Physician Assistant | Admitting: Physician Assistant

## 2022-02-07 ENCOUNTER — Ambulatory Visit: Payer: Medicare PPO | Admitting: Physician Assistant

## 2022-02-07 DIAGNOSIS — R338 Other retention of urine: Secondary | ICD-10-CM | POA: Insufficient documentation

## 2022-02-07 DIAGNOSIS — T83198A Other mechanical complication of other urinary devices and implants, initial encounter: Secondary | ICD-10-CM

## 2022-02-07 NOTE — Progress Notes (Signed)
Patient presented to clinic today with reports of nondraining Foley catheter x12 hours.  He is scheduled for SP tube insertion in 2 days.  His current catheter has been in place approximately 17 days.  He is not currently irrigating his catheter.  They were previously irrigating the catheter only when it became occluded.  When this failed to resolve his catheter occlusion, they discontinued irrigation entirely.  We will obtain KUB today to evaluate him for possible bladder stone given his recurrent, frequent catheter occlusion.  I have counseled them to resume 3 times daily vinegar bladder installations to keep the catheter patent.  I shared my concerns that his frequent catheter occlusion may require exchanges of his new suprapubic catheter before the tract heals well, which would increase his risk for trauma at the site or possible infection.  Vinegar instillation instructions and supplies provided for the patient today.  Cath Change/ Replacement  Patient is present today for a catheter change due to urinary retention.  38ml of water was removed from the balloon, a 20FR coud council tip foley cath was removed without difficulty.  Patient was cleaned and prepped in a sterile fashion with betadine and 2% lidocaine jelly was instilled into the urethra. A 18 FR silicone coud foley cath was replaced into the bladder no complications were noted Urine return was noted and urine was yellow in color. The balloon was filled with 19ml of sterile water. A night bag was attached for drainage.  Patient tolerated well.    Performed by: Carman Ching, PA-C   Follow up: SPT placement in 2 days with IR

## 2022-02-07 NOTE — Patient Instructions (Signed)
Vinegar Bladder Irrigation Protocol  Please start irrigating your bladder with a vinegar solution as described below to reduce urinary encrustation.  Most grocery stores carry white vinegar as a 5% solution. To make an appropriate bladder irrigation solution, you will need to dilute this at a ratio of roughly 20:1.  To achieve this, see the chart below to determine what amount of 5% white vinegar solution you should mix with your normal bladder irrigation (homemade saline or sterile saline from the pharmacy).  If you are starting with a saline volume of... ... , then add 2.5 tsp (12.60mL) of white vinegar .Marland KitchenMarland Kitchen , then add 5 tsp (42mL) of white vinegar .Marland Kitchen. , then add 10 tsp (47mL) of white vinegar ...1 gallon, then add about 6 oz of white vinegar  Instill 50mL of the vinegar solution through your catheter into the bladder three times daily. Leave the solution in the bladder for 10 minutes each time, then drain. Discontinue irrigation if it causes pain or discomfort. You may store any leftover solution in the refrigerator for up to 1 week.

## 2022-02-08 ENCOUNTER — Other Ambulatory Visit: Payer: Self-pay | Admitting: Radiology

## 2022-02-08 DIAGNOSIS — N138 Other obstructive and reflux uropathy: Secondary | ICD-10-CM

## 2022-02-08 NOTE — H&P (Signed)
Chief Complaint: Patient was seen in consultation today for No chief complaint on file.  at the request of Vaillancourt,Samantha  Referring Physician(s): Carman Ching  Supervising Physician: {Supervising Physician:[id number]}  Patient Status: {IR Consult Patient Status:21879}  History of Present Illness: RAND ETCHISON is a 88 y.o. male ***  Past Medical History:  Diagnosis Date   CHF (congestive heart failure) ([facility])    Complication of anesthesia    had trouble waking up in 2014   Enlarged prostate    Hyperlipidemia    Hypertension     Past Surgical History:  Procedure Laterality Date   CORONARY ANGIOPLASTY WITH STENT PLACEMENT     CYSTOSCOPY N/A 08/11/2020   Procedure: CYSTOSCOPY;  Surgeon: Riki Altes, MD;  Location: [facility] ORS;  Service: Urology;  Laterality: N/A;   CYSTOSCOPY WITH DIRECT VISION INTERNAL URETHROTOMY N/A 08/11/2020   Procedure: CYSTOSCOPY WITH DIRECT VISION INTERNAL URETHROTOMY;  Surgeon: Riki Altes, MD;  Location: [facility] ORS;  Service: Urology;  Laterality: N/A;   CYSTOSCOPY WITH URETHRAL DILATATION N/A 08/11/2020   Procedure: CYSTOSCOPY WITH URETHRAL DILATATION;  Surgeon: Riki Altes, MD;  Location: [facility] ORS;  Service: Urology;  Laterality: N/A;   HERNIA REPAIR     PROSTATE ABLATION      Allergies: Patient has no known allergies.  Medications: Prior to Admission medications   Medication Sig Start Date End Date Taking? Authorizing Provider  acetaminophen (TYLENOL) 500 MG tablet Take 500 mg by mouth every 6 (six) hours as needed for mild pain, moderate pain, headache or fever. Take with tramadol in the morning    [provider]  aspirin 81 MG EC tablet Take 81 mg by mouth daily.    [provider]  cyclobenzaprine (FLEXERIL) 5 MG tablet Take 5 mg by mouth at bedtime. 02/04/20   [provider]  doxazosin (CARDURA) 4 MG tablet Take 4 mg by mouth at bedtime. 12/30/19   [provider]  finasteride  (PROSCAR) 5 MG tablet Take 1 tablet (5 mg total) by mouth daily. 10/28/21   Vaillancourt, Lelon Mast, PA-C  furosemide (LASIX) 80 MG tablet Take 80 mg by mouth 2 (two) times daily. AM WITH BREAKFAST, AND THEN 1 AT [facility] 12/21/19   [provider]  isosorbide mononitrate (IMDUR) 60 MG 24 hr tablet Take 30 mg by mouth daily. 12/21/19   [provider]  metoprolol succinate (TOPROL-XL) 25 MG 24 hr tablet Take 12.5 mg by mouth daily. 12/21/19   [provider]  Multiple Vitamins-Minerals (MULTIVITAMIN WITH MINERALS) tablet Take 1 tablet by mouth daily.    [provider]  nitroGLYCERIN (NITROSTAT) 0.4 MG SL tablet Place 0.4 mg under the tongue every 5 (five) minutes as needed for chest pain. 02/04/20   [provider]  traMADol (ULTRAM) 50 MG tablet Take 50 mg by mouth 2 (two) times daily as needed for severe pain. 01/01/20   [provider]  trospium (SANCTURA) 20 MG tablet Take 1 tablet (20 mg total) by mouth daily. 10/28/21   Carman Ching, PA-C     No family history on file.  Social History   Socioeconomic History   Marital status: Married    Spouse name: Not on file   Number of children: Not on file   Years of education: Not on file   Highest education level: Not on file  Occupational History   Not on file  Tobacco Use   Smoking status: Never   Smokeless tobacco: Never  Vaping Use  Vaping Use: Never used  Substance and Sexual Activity   Alcohol use: Not Currently   Drug use: Not Currently   Sexual activity: Not Currently  Other Topics Concern   Not on file  Social History Narrative   Not on file   Social Determinants of Health   Financial Resource Strain: Not on file  Food Insecurity: Not on file  Transportation Needs: Not on file  Physical Activity: Not on file  Stress: Not on file  Social Connections: Not on file    ECOG Status: {CHL ONC ECOG ER:[id number]}  Review of Systems: A 12 point ROS discussed and  pertinent positives are indicated in the HPI above.  All other systems are negative.  Review of Systems  Vital Signs: There were no vitals taken for this visit.  Advance Care Plan: {Advance Care HUDJ:49702}    Physical Exam  Imaging: DG Abd 1 View  Result Date: 02/07/2022 CLINICAL DATA:  Recurrent catheter occlusion, bladder calculus EXAM: ABDOMEN - 1 VIEW COMPARISON:  05/31/2021 FINDINGS: Two supine frontal views of the abdomen and pelvis are obtained. Foley catheter overlies the pelvis. I do not see any radiopaque urinary tract calculi. No bowel obstruction or ileus. No acute bony abnormalities. IMPRESSION: 1. Foley catheter overlies the pelvis. 2. No radiopaque urinary tract calculi. Electronically Signed   By: Sharlet Salina M.D.   On: 02/07/2022 22:19    Labs:  CBC: Recent Labs    08/19/21 0637 08/20/21 0433 08/21/21 0405 08/22/21 0524  WBC 10.3 10.1 10.2 10.7*  HGB 10.4* 11.0* 10.4* 11.0*  HCT 30.5* 32.7* 31.0* 32.0*  PLT 128* 136* 142* 160    COAGS: No results for input(s): "INR", "APTT" in the last 8760 hours.  BMP: Recent Labs    08/19/21 0637 08/20/21 0433 08/21/21 0405 08/22/21 0524  NA 137 138 136 137  K 3.7 4.0 4.4 4.3  CL 104 106 101 102  CO2 26 27 26 26   GLUCOSE 108* 104* 124* 116*  BUN 19 21 28* 32*  CALCIUM 8.3* 8.7* 8.9 8.8*  CREATININE 1.24 1.14 1.27* 1.39*  GFRNONAA 56* >60 54* 49*    Assessment and Plan: This is a 88 year old male with PMHx significant for BPH with urinary obstruction and urinary retention who follows with urology and has been managed with a chronic indwelling foley catheter. The patient has had issues with urinary foley catheter causing bleeding given his history of tortuous prostatic urethra and bladder neck contraction making it a difficult foley catheter placement. The patient has also had multiple episodes of foley catheter encrustation and required multiple bladder irrigations and foley catheter exchanges. The patient  presents today for suprapubic catheter placement.    The patient has been NPO, no blood thinners taken, labs and vitals have been reviewed.  Risks and benefits of image guided suprapubic catheter placement with moderate sedation was discussed with the patient and/or patient's family including, but not limited to bleeding, infection, damage to adjacent structures and need for additional procedures and catheter exchanges.   All of the questions were answered and there is agreement to proceed.  Consent signed and in chart.    Thank you for this interesting consult.  I greatly enjoyed meeting RAVI TUCCILLO and look forward to participating in their care.  A copy of this report was sent to the requesting provider on this date.  Electronically Signed: Abbey Chatters, PA-C 02/08/2022, 4:30 PM   I spent a total of 15 Minutes in face to  face in clinical consultation, greater than 50% of which was counseling/coordinating care for suprapubic catheter placement for urinary retention.

## 2022-02-09 ENCOUNTER — Other Ambulatory Visit: Payer: Self-pay | Admitting: Physician Assistant

## 2022-02-09 ENCOUNTER — Ambulatory Visit
Admission: RE | Admit: 2022-02-09 | Discharge: 2022-02-09 | Disposition: A | Discharge status: Home / Self Care | Payer: Medicare PPO | Source: Ambulatory Visit | Attending: Physician Assistant | Admitting: Physician Assistant

## 2022-02-09 ENCOUNTER — Other Ambulatory Visit: Payer: Self-pay

## 2022-02-09 DIAGNOSIS — R338 Other retention of urine: Secondary | ICD-10-CM | POA: Diagnosis present

## 2022-02-09 DIAGNOSIS — N138 Other obstructive and reflux uropathy: Secondary | ICD-10-CM | POA: Diagnosis not present

## 2022-02-09 DIAGNOSIS — N401 Enlarged prostate with lower urinary tract symptoms: Secondary | ICD-10-CM | POA: Insufficient documentation

## 2022-02-09 DIAGNOSIS — T83198A Other mechanical complication of other urinary devices and implants, initial encounter: Secondary | ICD-10-CM | POA: Insufficient documentation

## 2022-02-09 LAB — CBC
HCT: 38.9 % — ABNORMAL LOW (ref 39.0–52.0)
Hemoglobin: 13.4 g/dL (ref 13.0–17.0)
MCH: 32.4 pg (ref 26.0–34.0)
MCHC: 34.4 g/dL (ref 30.0–36.0)
MCV: 94 fL (ref 80.0–100.0)
Platelets: 155 10*3/uL (ref 150–400)
RBC: 4.14 MIL/uL — ABNORMAL LOW (ref 4.22–5.81)
RDW: 14.1 % (ref 11.5–15.5)
WBC: 8.6 10*3/uL (ref 4.0–10.5)
nRBC: 0 % (ref 0.0–0.2)

## 2022-02-09 LAB — PROTIME-INR
INR: 1.1 (ref 0.8–1.2)
Prothrombin Time: 14 seconds (ref 11.4–15.2)

## 2022-02-09 MED ORDER — SODIUM CHLORIDE 0.9 % IV BOLUS
INTRAVENOUS | Status: AC | PRN
  Administered 2022-02-09: 250 mL via INTRAVENOUS

## 2022-02-09 MED ORDER — FENTANYL CITRATE (PF) 100 MCG/2ML IJ SOLN
INTRAMUSCULAR | Status: AC | PRN
  Administered 2022-02-09: 25 ug via INTRAVENOUS

## 2022-02-09 MED ORDER — SODIUM CHLORIDE 0.9 % IV SOLN: INTRAVENOUS | Status: DC

## 2022-02-09 MED ORDER — FENTANYL CITRATE (PF) 100 MCG/2ML IJ SOLN
INTRAMUSCULAR | Status: AC
  Filled 2022-02-09: qty 2

## 2022-02-09 MED ORDER — MIDAZOLAM HCL 2 MG/2ML IJ SOLN
INTRAMUSCULAR | Status: AC | PRN
  Administered 2022-02-09: .5 mg via INTRAVENOUS

## 2022-02-09 MED ORDER — MIDAZOLAM HCL 2 MG/2ML IJ SOLN
INTRAMUSCULAR | Status: AC
  Filled 2022-02-09: qty 4

## 2022-02-09 NOTE — Procedures (Signed)
Pre procedural Dx: Urinary retention Post procedural Dx: Same  Successful [place] and CT guided placed of a 16 Fr drainage catheter placement into the urinary bladder. Suprapubic catheter connected to foley bag.    EBL: Trace Complications: None immediate  Katherina Right, MD Pager #: [phone]

## 2022-02-11 ENCOUNTER — Telehealth: Payer: Self-pay

## 2022-02-11 NOTE — Telephone Encounter (Signed)
Incoming call on triage line from pts daughter in regards to pt's newly placed SPT. She called to report pt was doing well and was very pleased with how the procedure went. She was told that pt would not need to return to IR for pt's first exchange. Per Sam, that is correct, pt will need a 4 week cath exchange in office. Appt made.

## 2022-02-14 ENCOUNTER — Ambulatory Visit: Payer: Medicare PPO | Admitting: Physician Assistant

## 2022-02-14 DIAGNOSIS — R338 Other retention of urine: Secondary | ICD-10-CM | POA: Diagnosis not present

## 2022-02-14 DIAGNOSIS — T83198A Other mechanical complication of other urinary devices and implants, initial encounter: Secondary | ICD-10-CM | POA: Diagnosis not present

## 2022-02-14 NOTE — Progress Notes (Signed)
Patient presented to clinic today with decreased urinary drainage and abdominal distention 5 days after suprapubic catheter placement by IR.  He was having urine draining from his penis and around the suprapubic catheter.  I disconnected the drainage tubing from his 30 French pigtail suprapubic catheter and there was immediate, brisk urine flow from the catheter.  On further inspection, it appeared that the connector was occluded, but the tubing itself was not.  Patient reported that they stopped vinegar installations after placement of the SPT because the tubing is different than they were used to and they were unsure of how to perform this procedure.  Ultimately, we drained approximately 400 cc from his bladder.  I reattached a pigtail connector tube attached to a night bag today and showed him how to remove the night bag to perform vinegar installations.  I counseled them to continue with vinegar installations 3 times daily to keep the SP tube patent.  They expressed understanding.  We will plan to have them follow-up with IR for upsizing and exchange and return to our clinic per IR recommendations or as needed.  Carman Ching, PA-C 02/14/22 5:11 PM  I spent 12 minutes on the day of the encounter to include pre-visit record review, face-to-face time with the patient, and post-visit ordering of tests.

## 2022-02-15 ENCOUNTER — Ambulatory Visit: Payer: Medicare PPO | Admitting: Physician Assistant

## 2022-02-18 ENCOUNTER — Telehealth: Payer: Self-pay | Admitting: *Deleted

## 2022-02-18 NOTE — Telephone Encounter (Signed)
Patient called with concerns post op SPT placement. Reviewed post op instructions. Patient reports bladder spasms and leakage. Has been taking Trospium daily. Advised if symptoms of spasms worsen to notify the office. Requests leg bag, left supplies for patient to pick up. Voiced understanding.

## 2022-02-22 ENCOUNTER — Ambulatory Visit: Payer: Medicare PPO | Admitting: Urology

## 2022-02-22 ENCOUNTER — Encounter: Payer: Self-pay | Admitting: Urology

## 2022-02-22 DIAGNOSIS — T8389XA Other specified complication of genitourinary prosthetic devices, implants and grafts, initial encounter: Secondary | ICD-10-CM

## 2022-02-22 DIAGNOSIS — N390 Urinary tract infection, site not specified: Secondary | ICD-10-CM

## 2022-02-22 MED ORDER — CEFUROXIME AXETIL 500 MG PO TABS: 500.0000 mg | ORAL_TABLET | Freq: Two times a day (BID) | ORAL | 0 refills | Status: DC

## 2022-02-22 NOTE — Progress Notes (Signed)
02/22/2022 9:42 PM   James Mata 1931-01-24 [id number]  Referring provider: Alan Mulder, MD [address] [place],  [place] [address]  Urological history 1. BPH with retention - s/p PVP 2014 - currently managed with an indwelling Foley/SPT   2. Elevated PSA - previous prostate biopsy late 1990's for PSA 4.7 - PSA screening discontinued due to age and co-mobidities   3. Bladder neck contracture - cystoscopy with dilation and catheter placement by Dr. Mena Goes with findings of an 8 French stricture versus bladder neck contracture.  He was able to place a 16 French [place] catheter in 01/2020 - cysto 02/2020 NED  4. High risk hematuria -non-smoker -cysto, 07/2020 - torturous prostatic urethra with adenoma regrowth -no reports of gross heme  Chief Complaint  Patient presents with   Urinary Retention    HPI: James Mata is a 88 y.o. male who presents with his daughter, James Mata, after his SPT stopped draining and family was unable to irrigate.  He underwent placement of a 40fr percutaneous drainage catheter with end coiled and locked within the urinary bladder with IR on 02/09/2022.    His family noticed that the SPT was expelling urine from the SPT site.  This morning his family noticed that the SPT had stopped draining overnight and his family was unable to irrigate.  [name] stated that he started experiencing a foul odor with his catheter on Saturday.    Patient denies any modifying or aggravating factors.  Patient denies any gross hematuria, dysuria or suprapubic/flank pain.  Patient denies any fevers, chills, nausea or vomiting.      PMH: Past Medical History:  Diagnosis Date   CHF (congestive heart failure) (HCC)    Complication of anesthesia    had trouble waking up in 2014   Enlarged prostate    Hyperlipidemia    Hypertension     Surgical History: Past Surgical History:  Procedure Laterality Date   CORONARY ANGIOPLASTY WITH STENT PLACEMENT      CYSTOSCOPY N/A 08/11/2020   Procedure: CYSTOSCOPY;  Surgeon: Riki Altes, MD;  Location: [facility];  Service: Urology;  Laterality: N/A;   CYSTOSCOPY WITH DIRECT VISION INTERNAL URETHROTOMY N/A 08/11/2020   Procedure: CYSTOSCOPY WITH DIRECT VISION INTERNAL URETHROTOMY;  Surgeon: Riki Altes, MD;  Location: [facility];  Service: Urology;  Laterality: N/A;   CYSTOSCOPY WITH URETHRAL DILATATION N/A 08/11/2020   Procedure: CYSTOSCOPY WITH URETHRAL DILATATION;  Surgeon: Riki Altes, MD;  Location: [facility];  Service: Urology;  Laterality: N/A;   HERNIA REPAIR     PROSTATE ABLATION      Home Medications:  Allergies as of 02/22/2022   No Known Allergies      Medication List        Accurate as of February 22, 2022  9:42 PM. If you have any questions, ask your nurse or doctor.          acetaminophen 500 MG tablet Commonly known as: TYLENOL Take 500 mg by mouth every 6 (six) hours as needed for mild pain, moderate pain, headache or fever. Take with tramadol in the morning   aspirin EC 81 MG tablet Take 81 mg by mouth daily.   cefUROXime 500 MG tablet Commonly known as: CEFTIN Take 1 tablet (500 mg total) by mouth 2 (two) times daily with a meal. Started by: Michiel Cowboy, PA-C   cyclobenzaprine 5 MG tablet Commonly known as: FLEXERIL Take 5 mg by mouth at bedtime.   doxazosin  4 MG tablet Commonly known as: CARDURA Take 4 mg by mouth at bedtime.   finasteride 5 MG tablet Commonly known as: PROSCAR Take 1 tablet (5 mg total) by mouth daily.   furosemide 80 MG tablet Commonly known as: LASIX Take 80 mg by mouth 2 (two) times daily. AM WITH BREAKFAST, AND THEN 1 AT [facility]   isosorbide mononitrate 60 MG 24 hr tablet Commonly known as: IMDUR Take 30 mg by mouth daily.   metoprolol succinate 25 MG 24 hr tablet Commonly known as: TOPROL-XL Take 12.5 mg by mouth daily.   multivitamin with minerals tablet Take 1 tablet by mouth daily.   nitroGLYCERIN 0.4 MG  SL tablet Commonly known as: NITROSTAT Place 0.4 mg under the tongue every 5 (five) minutes as needed for chest pain.   traMADol 50 MG tablet Commonly known as: ULTRAM Take 50 mg by mouth 2 (two) times daily as needed for severe pain.   trospium 20 MG tablet Commonly known as: SANCTURA Take 1 tablet (20 mg total) by mouth daily.        Allergies: No Known Allergies  Family History: History reviewed. No pertinent family history.  Social History:  reports that he has never smoked. He has never used smokeless tobacco. He reports that he does not currently use alcohol. He reports that he does not currently use drugs.  ROS: Pertinent ROS in HPI  Physical Exam: Constitutional:  Well nourished. Alert and oriented, No acute distress. HEENT: [place] AT, moist mucus membranes.  Trachea midline Cardiovascular: No clubbing, cyanosis, or edema. Respiratory: Normal respiratory effort, no increased work of breathing. GI: Abdomen is soft, non tender, non distended, no abdominal masses.  SPT site is clean and dry.  No urine is seen in the bag and thick white mucus is seen in the tubing.  GU: No CVA tenderness.  No bladder fullness or masses.  Patient with uncircumcised phallus. Foreskin easily retracted   Neurologic: Grossly intact, no focal deficits, moving all 4 extremities. Psychiatric: Normal mood and affect.  Laboratory Data: Lab Results  Component Value Date   WBC 8.6 02/09/2022   HGB 13.4 02/09/2022   HCT 38.9 (L) 02/09/2022   MCV 94.0 02/09/2022   PLT 155 02/09/2022    Lab Results  Component Value Date   CREATININE 1.39 (H) 08/22/2021   Urinalysis    Component Value Date/Time   COLORURINE YELLOW 08/29/2021 2147   APPEARANCEUR CLEAR 08/29/2021 2147   APPEARANCEUR Cloudy (A) 08/05/2020 1618   LABSPEC 1.015 08/29/2021 2147   PHURINE 7.0 08/29/2021 2147   GLUCOSEU NEGATIVE 08/29/2021 2147   HGBUR MODERATE (A) 08/29/2021 2147   BILIRUBINUR NEGATIVE 08/29/2021 2147    BILIRUBINUR Negative 08/05/2020 1618   KETONESUR NEGATIVE 08/29/2021 2147   PROTEINUR 30 (A) 08/29/2021 2147   NITRITE POSITIVE (A) 08/29/2021 2147   LEUKOCYTESUR LARGE (A) 08/29/2021 2147  I have reviewed the labs.   Pertinent Imaging: N/A  I instilled 30 mL of renacidin into his SPT and clamped the tube for ~ 15 minutes.  It was then irrigated easily with 60 cc of sterile water.   Assessment & Plan:    1. Occluded SPT  -explained that his SPT needs to be irrigated daily to avoid future occlusions -Ceftin 500 mg, BID x 7 days  Return in about 6 weeks (around 04/05/2022) for follow up with IR for conversion to council 16 fr Foley .  These notes generated with voice recognition software. I apologize for typographical errors.  Rilie Glanz  Eula Flax  [facility] [address]  [address] [place], [place] [address] [phone]   I spent 30 minutes on the day of the encounter to include pre-visit record review, face-to-face time with the patient, and post-visit ordering of tests.

## 2022-02-24 ENCOUNTER — Telehealth: Payer: Self-pay | Admitting: Physician Assistant

## 2022-02-24 NOTE — Telephone Encounter (Signed)
Pt's daughter, Darel Hong, has a few questions she'd like to ask Sam.  Sam told pt and Annette Stable that she was going to be the first person to change the catheter.  She would like to confirm.  Also the gauge dressing looks good, they change every day or so.  They were told they didn't need to change that anymore.  They were also told NOT to irrigate, unless there's a problem according to Dr Grace Isaac.  They were here Tuesday and told they need to irrigate everyday.   Pt's appt for 8/21 was canceled and pt was told he would have to go to IR to have this done.  IR told them there was no reason for them to come back there.  They are SO confused hearing so many different things and would like some clarification.  Please give her a call  Judy [phone]

## 2022-02-25 NOTE — Telephone Encounter (Signed)
Pt daughter called again and had another question. She was told by IR to NOT irrigate, but was told by [place] to Irrigate. She is confused.

## 2022-02-25 NOTE — Telephone Encounter (Signed)
I just spoke with James Mata via telephone.   Patient to irrigate once daily for now to keep catheter patent.  Ok to stop gauze dressing or continue per patient preference.  I'll coordinate with IR his next change/possible upsize and determine best location for that and reach out to them next week with the plan.

## 2022-02-28 NOTE — Telephone Encounter (Signed)
Let's do his first exchange to a 16Fr Silastic here in our clinic. Please schedule him for SPT change with me sometime this week.

## 2022-02-28 NOTE — Telephone Encounter (Signed)
Spoke with daughter and scheduled appt.

## 2022-03-01 ENCOUNTER — Ambulatory Visit: Payer: Medicare PPO | Admitting: Physician Assistant

## 2022-03-01 DIAGNOSIS — Z435 Encounter for attention to cystostomy: Secondary | ICD-10-CM

## 2022-03-01 DIAGNOSIS — R339 Retention of urine, unspecified: Secondary | ICD-10-CM | POA: Diagnosis not present

## 2022-03-01 NOTE — Progress Notes (Signed)
Suprapubic Cath Change  Patient is present today for a suprapubic catheter change due to urinary retention.  Securing stitch was snipped to free the pigtail catheter at the level of the skin and then the 16Fr pigtail catheter was transected to release the coil. Catheter was removed with some difficulty due to urinary encrustation.  Site was cleaned and prepped in a sterile fashion with betadine.  A 16FR Silastic foley cath was replaced into the tract no complications were noted. Urine return was noted, 10 ml of sterile water was inflated into the balloon and a night bag was attached for drainage.  Patient tolerated well.   Performed by: Carman Ching, PA-C, Vanna Scotland, MD, and Franchot Erichsen, CMA  Follow up: Return in about 3 weeks (around 03/22/2022) for SPT exchange.

## 2022-03-04 ENCOUNTER — Telehealth: Payer: Self-pay | Admitting: *Deleted

## 2022-03-04 NOTE — Telephone Encounter (Signed)
Patient's wife called concerned with redness near area that was covered with bandage after SPT placement. Denies any oozing, drainage or fever. Explained skin could have some redness after being covered. Informed if area is worsening to notify the office. Voiced understanding.

## 2022-03-14 ENCOUNTER — Ambulatory Visit: Payer: Medicare PPO | Admitting: Physician Assistant

## 2022-03-21 ENCOUNTER — Ambulatory Visit: Payer: Medicare PPO | Admitting: Physician Assistant

## 2022-03-21 DIAGNOSIS — Z435 Encounter for attention to cystostomy: Secondary | ICD-10-CM | POA: Diagnosis not present

## 2022-03-21 DIAGNOSIS — R339 Retention of urine, unspecified: Secondary | ICD-10-CM

## 2022-03-21 NOTE — Progress Notes (Signed)
Suprapubic Cath Change  Patient is present today for a suprapubic catheter change due to urinary retention.  31ml of water was drained from the balloon, a 16FR Silastic foley cath was removed from the tract without difficulty.  Site was cleaned and prepped in a sterile fashion with betadine.  A 16FR Silastic foley cath was replaced into the tract no complications were noted. Urine return was noted, 10 ml of sterile water was inflated into the balloon and a night bag was attached for drainage.  Patient tolerated well.     Performed by: Carman Ching, PA-C   Follow up: Return in about 3 weeks (around 04/11/2022) for SPT exchange.

## 2022-03-22 ENCOUNTER — Encounter: Payer: Medicare PPO | Admitting: Physician Assistant

## 2022-04-01 ENCOUNTER — Telehealth: Payer: Self-pay

## 2022-04-01 NOTE — Telephone Encounter (Signed)
Message on triage line from [place], RN with [facility], he states that this pt's daughter is requesting verbal orders allowing him to change the patient's catheter on 04/12/22. He will need type of catheter and size specified if ok to change. Please advise.

## 2022-04-04 NOTE — Telephone Encounter (Signed)
OK to undergo SPT changes with home health starting around 04/11/2022. SPT should be exchanged every 3 weeks or as needed due to urinary encrustation. He requires a 16Fr Silastic catheter, 16Fr silicone if Silastic is not available.

## 2022-04-05 NOTE — Telephone Encounter (Signed)
.  left message to have patient's RN return my call.

## 2022-04-08 NOTE — Telephone Encounter (Signed)
Spoke with daughter and she will text Trey Paula and let him know the information.

## 2022-04-12 ENCOUNTER — Encounter: Payer: Medicare PPO | Admitting: Physician Assistant

## 2022-05-02 ENCOUNTER — Telehealth: Payer: Self-pay | Admitting: Physician Assistant

## 2022-05-02 NOTE — Telephone Encounter (Signed)
James Mata with [facility] called the office requesting a signature on an order.  Her phone number is [phone] and fax number is [phone].  Please advise.

## 2022-05-02 NOTE — Telephone Encounter (Signed)
Spoke with stephanie and she has already received the order

## 2022-06-30 IMAGING — DX DG CHEST 1V PORT
1 series · 1 of 1 positions shown · non-contrast
Comparison: None.

CLINICAL DATA: Hypoxia.

EXAM:
PORTABLE CHEST 1 VIEW

[chest ap]
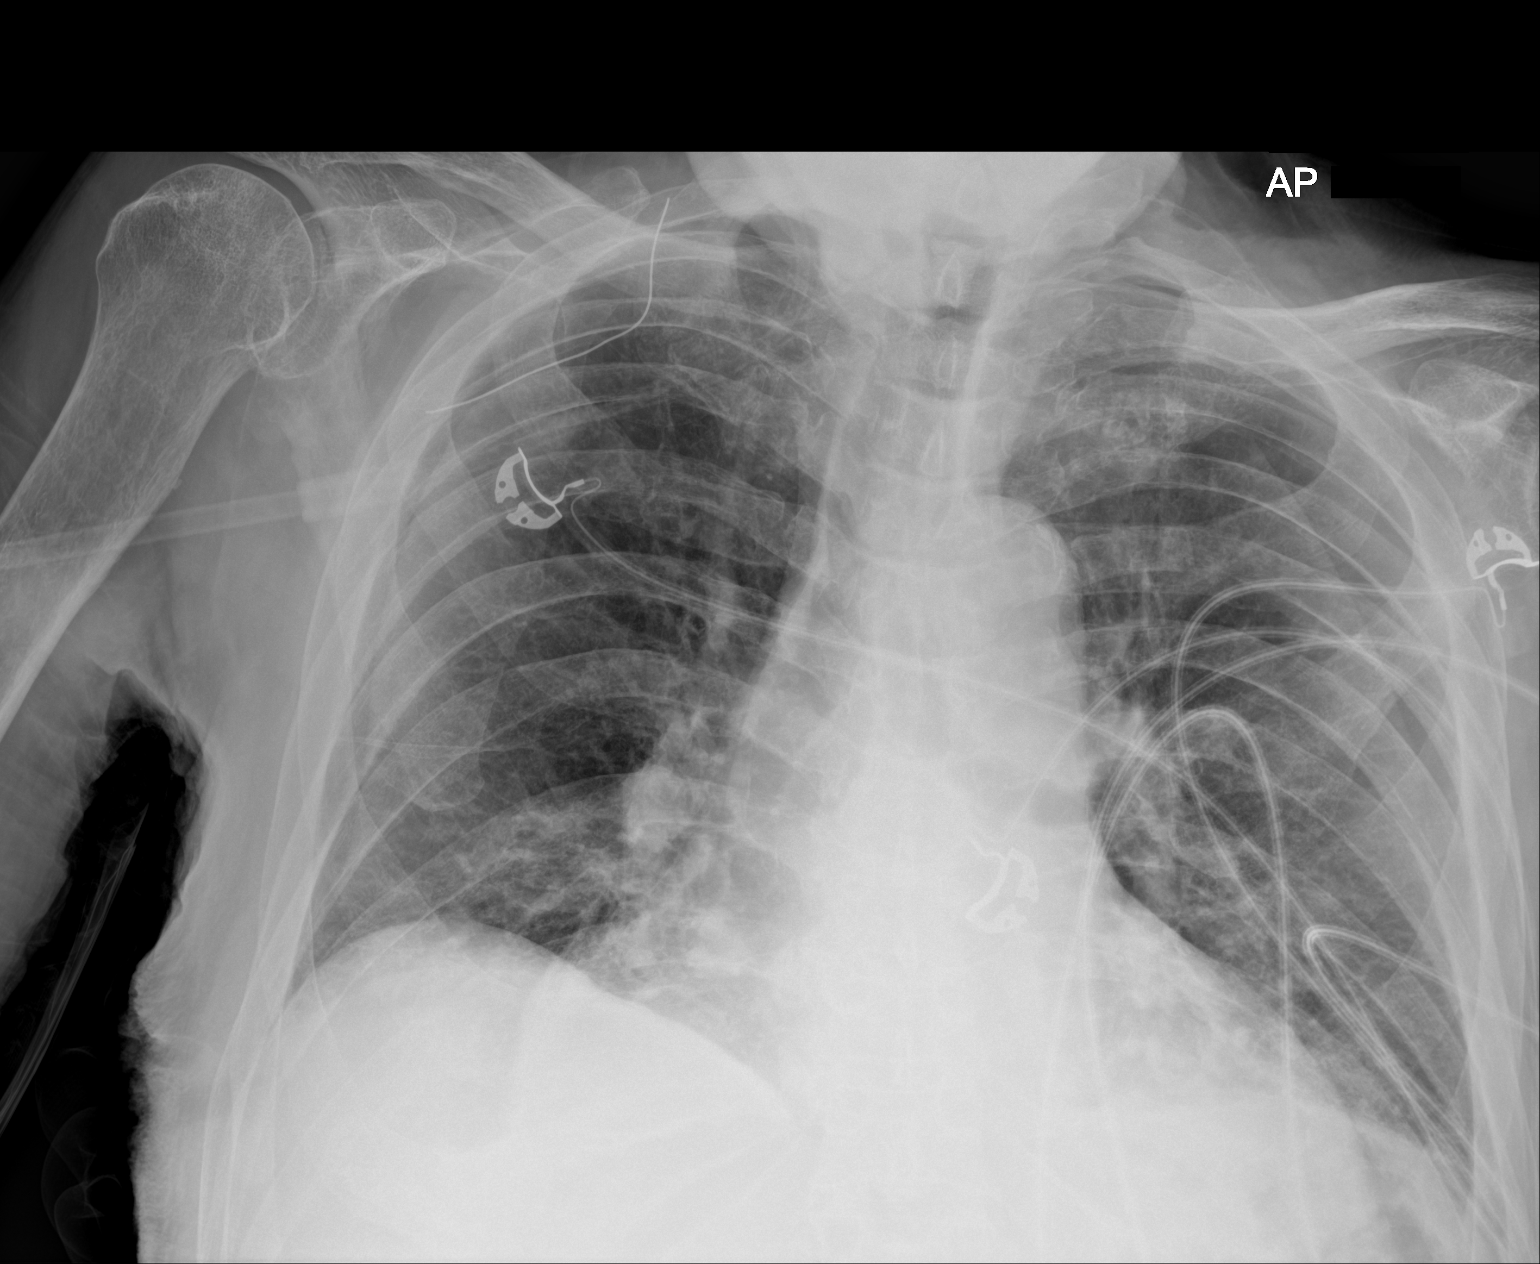

[1 of 1 positions shown; findings below may reference images not displayed]

FINDINGS: Bilateral lower lung field interstitial prominence may represent
atelectasis or scarring. Developing infiltrate is less likely but
not excluded clinical correlation is recommended. No focal
consolidation, pleural effusion, or pneumothorax. Mild cardiomegaly.
Atherosclerotic calcification of the aorta. No acute osseous
pathology.
IMPRESSION: 1. Bibasilar atelectasis or scarring. No focal consolidation.
2. Mild cardiomegaly.

## 2022-06-30 IMAGING — US US EXTREM LOW VENOUS*L*
1 series · 14 of 24 positions shown · non-contrast
Comparison: None.

CLINICAL DATA: Bilateral lower extremity swelling, left greater
than right. Clinical concern for DVT.

EXAM:
LEFT LOWER EXTREMITY VENOUS DOPPLER ULTRASOUND
TECHNIQUE: Gray-scale sonography with compression, as well as color and duplex
ultrasound, were performed to evaluate the deep venous system(s)
from the level of the common femoral vein through the popliteal and
proximal calf veins.

[Series 1: us venous img lower uni left (dvt) · portal-venous · 14 of 33 slices shown]
[im 1/33]
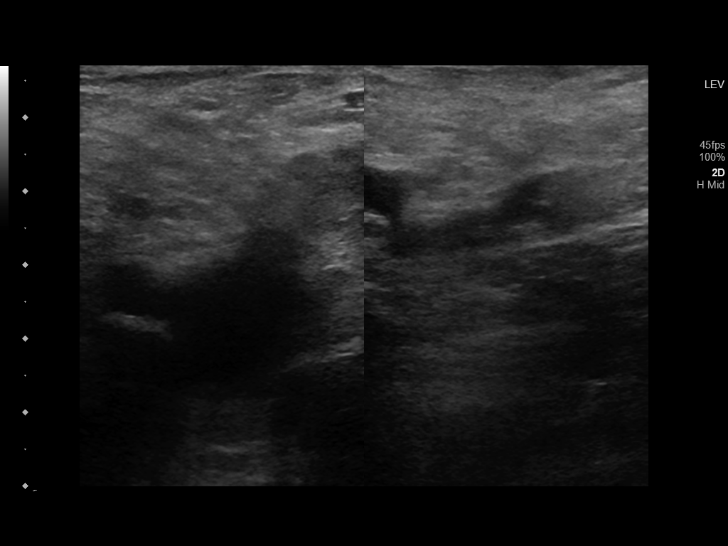
[im 3/33]
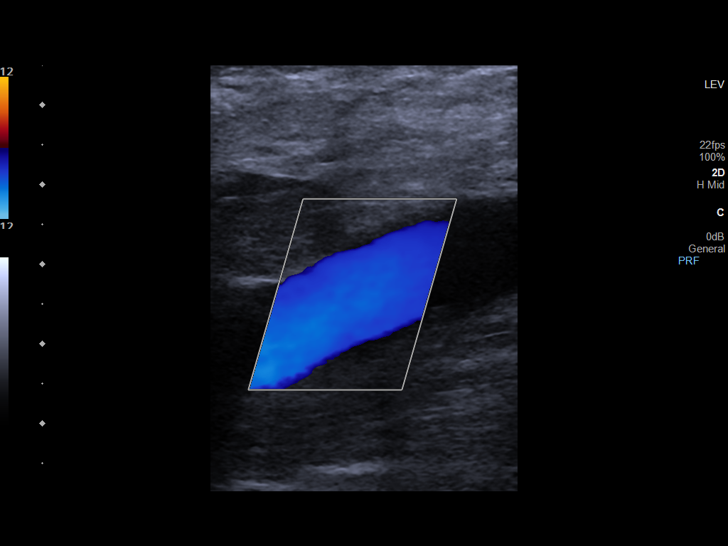
[im 6/33]
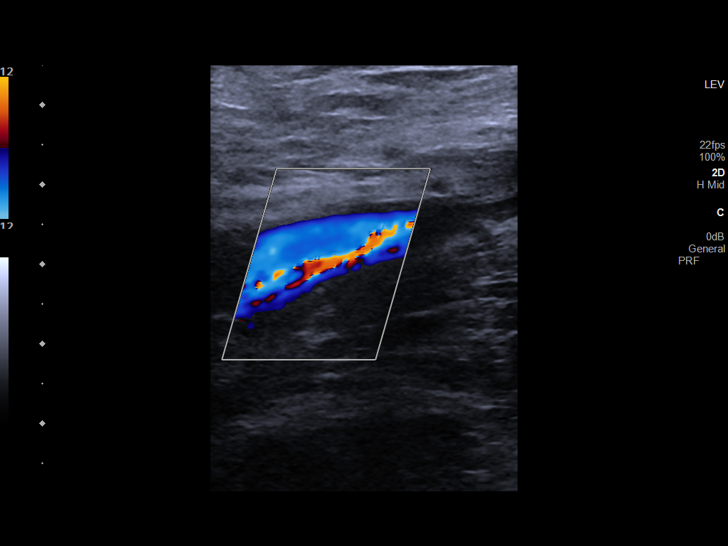
[im 9/33]
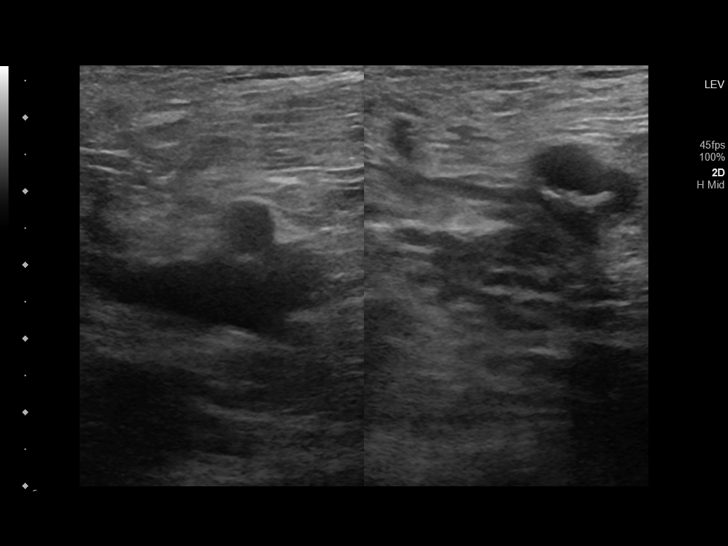
[im 10/33]
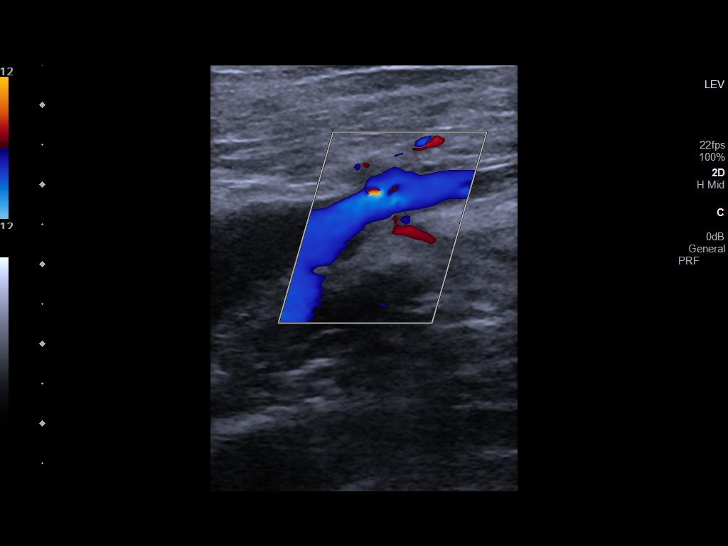
[im 13/33]
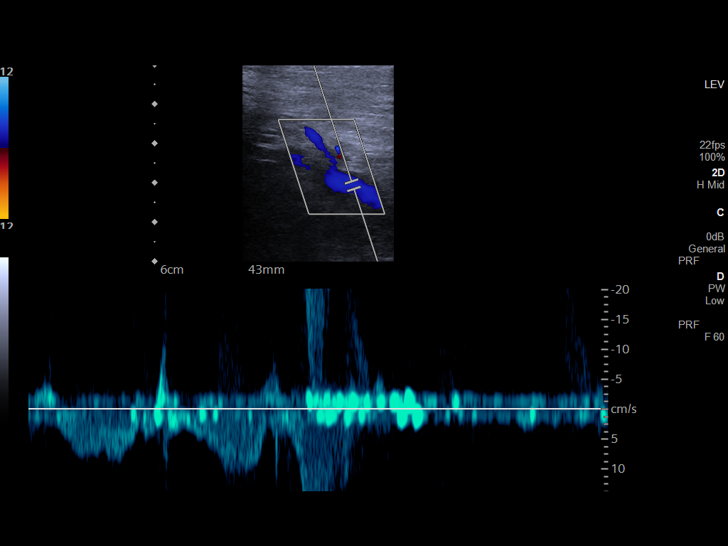
[im 16/33]
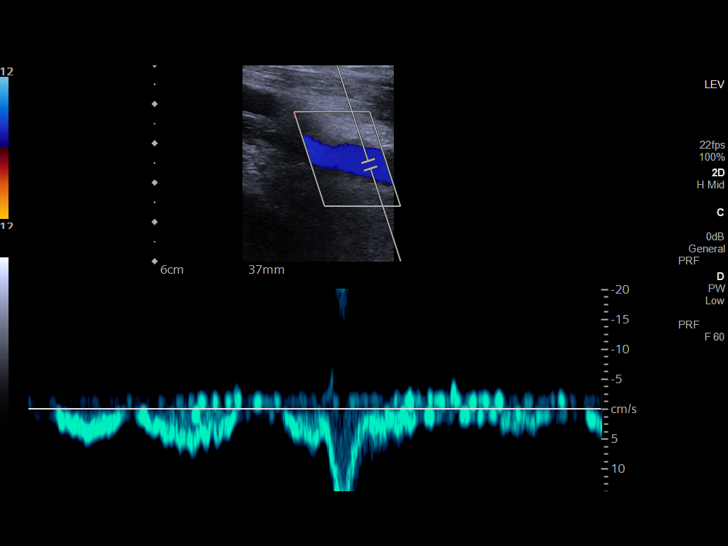
[im 17/33]
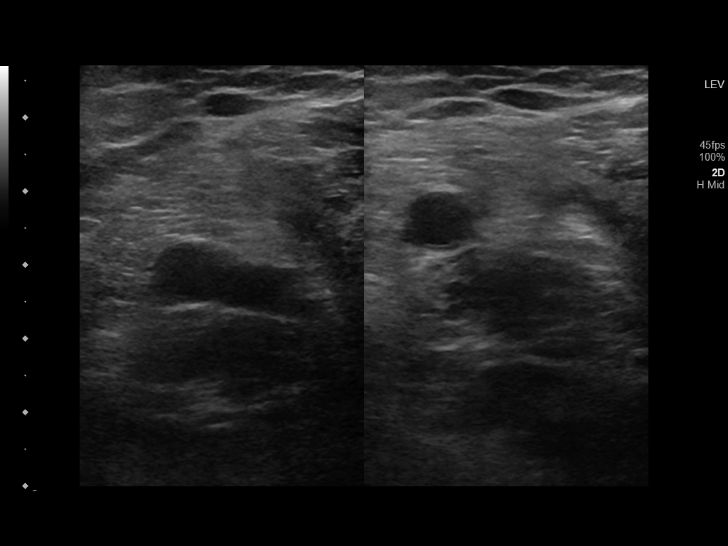
[im 20/33]
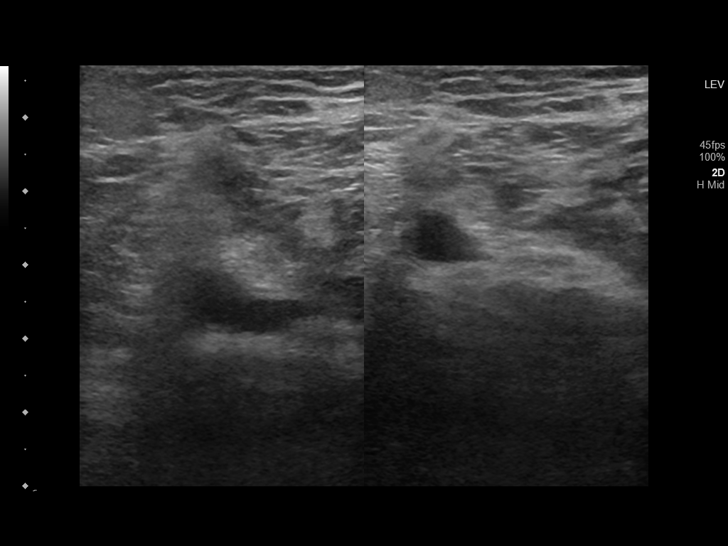
[im 23/33]
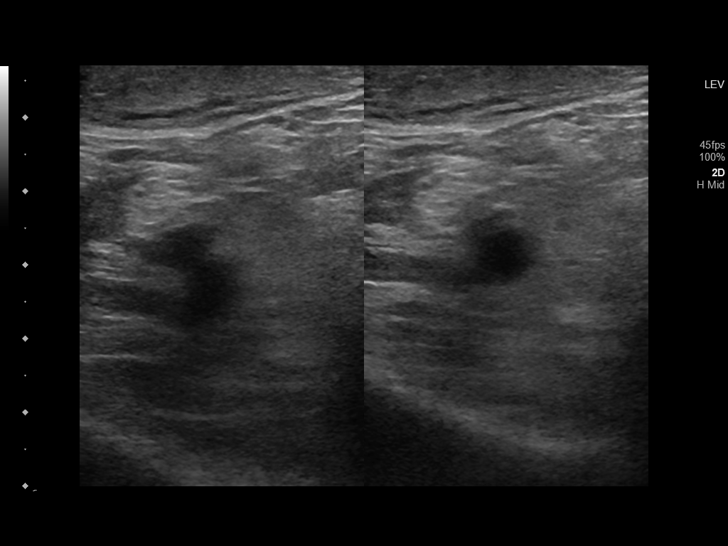
[im 26/33]
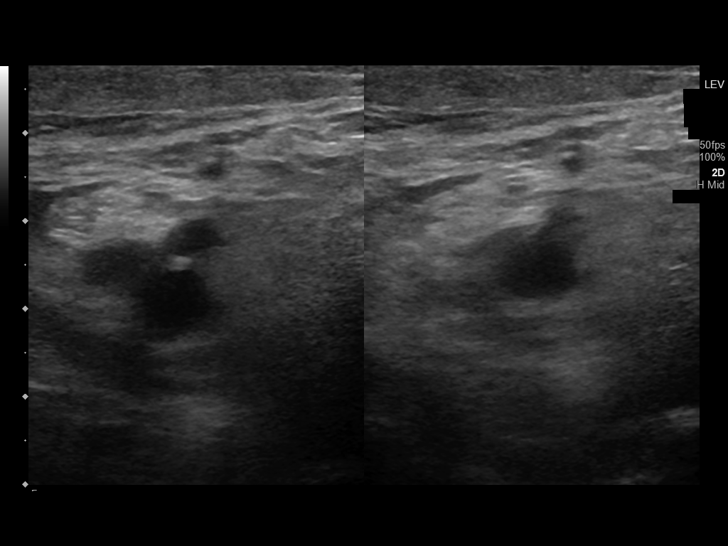
[im 27/33]
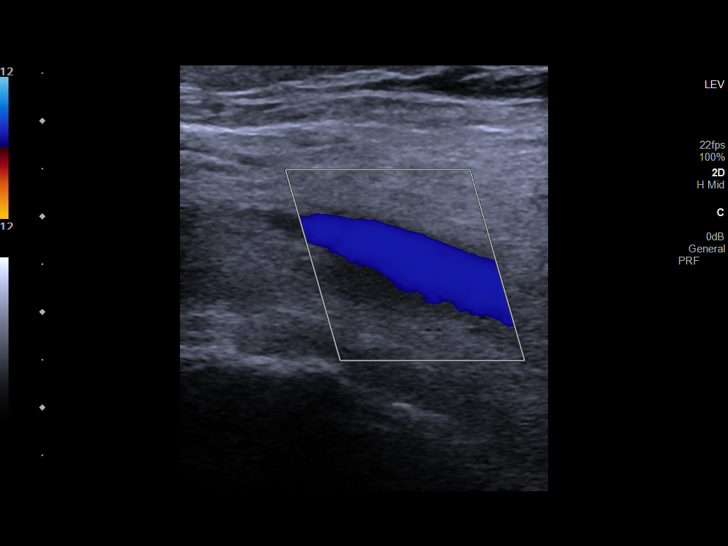
[im 30/33]
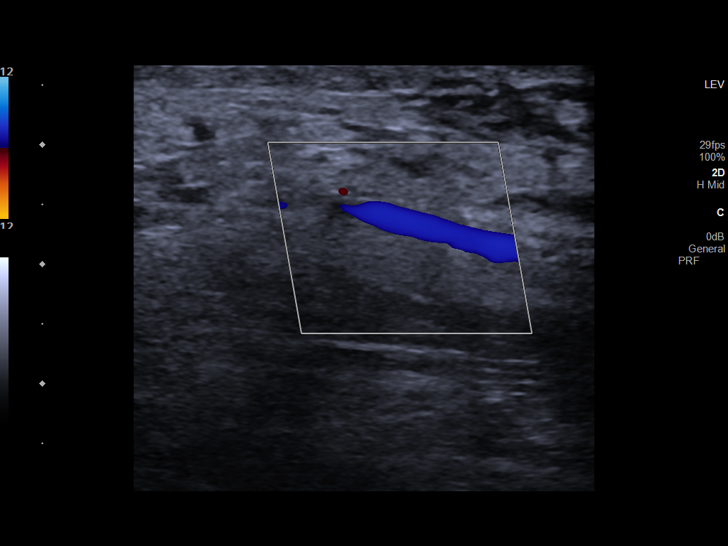
[im 33/33]
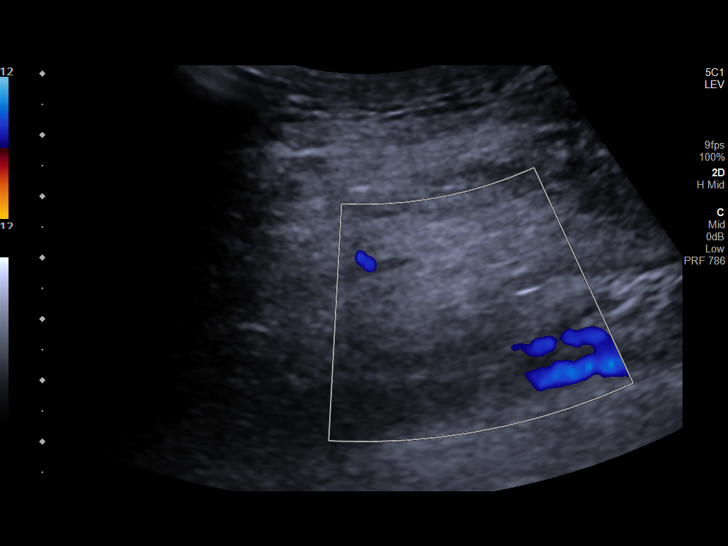

[14 of 24 positions shown; findings below may reference images not displayed]

FINDINGS: VENOUS

Normal compressibility of the common femoral, superficial femoral,
and popliteal veins, as well as the visualized calf veins.
Visualized portions of profunda femoral vein and great saphenous
vein unremarkable. No filling defects to suggest DVT on grayscale or
color Doppler imaging. Doppler waveforms show normal direction of
venous flow, normal respiratory plasticity and response to
augmentation.

Limited views of the contralateral common femoral vein are
unremarkable.

OTHER

Mild soft tissue edema noted in the lower leg without apparent focal
fluid collection.

Limitations: none
IMPRESSION: No evidence of acute left lower extremity DVT.

## 2022-07-05 IMAGING — DX DG CHEST 1V PORT
1 series · 1 of 1 positions shown · non-contrast
Comparison: 08/13/2021 and older studies.

CLINICAL DATA: Dyspnea and fever.

EXAM:
PORTABLE CHEST 1 VIEW

[chest ap]
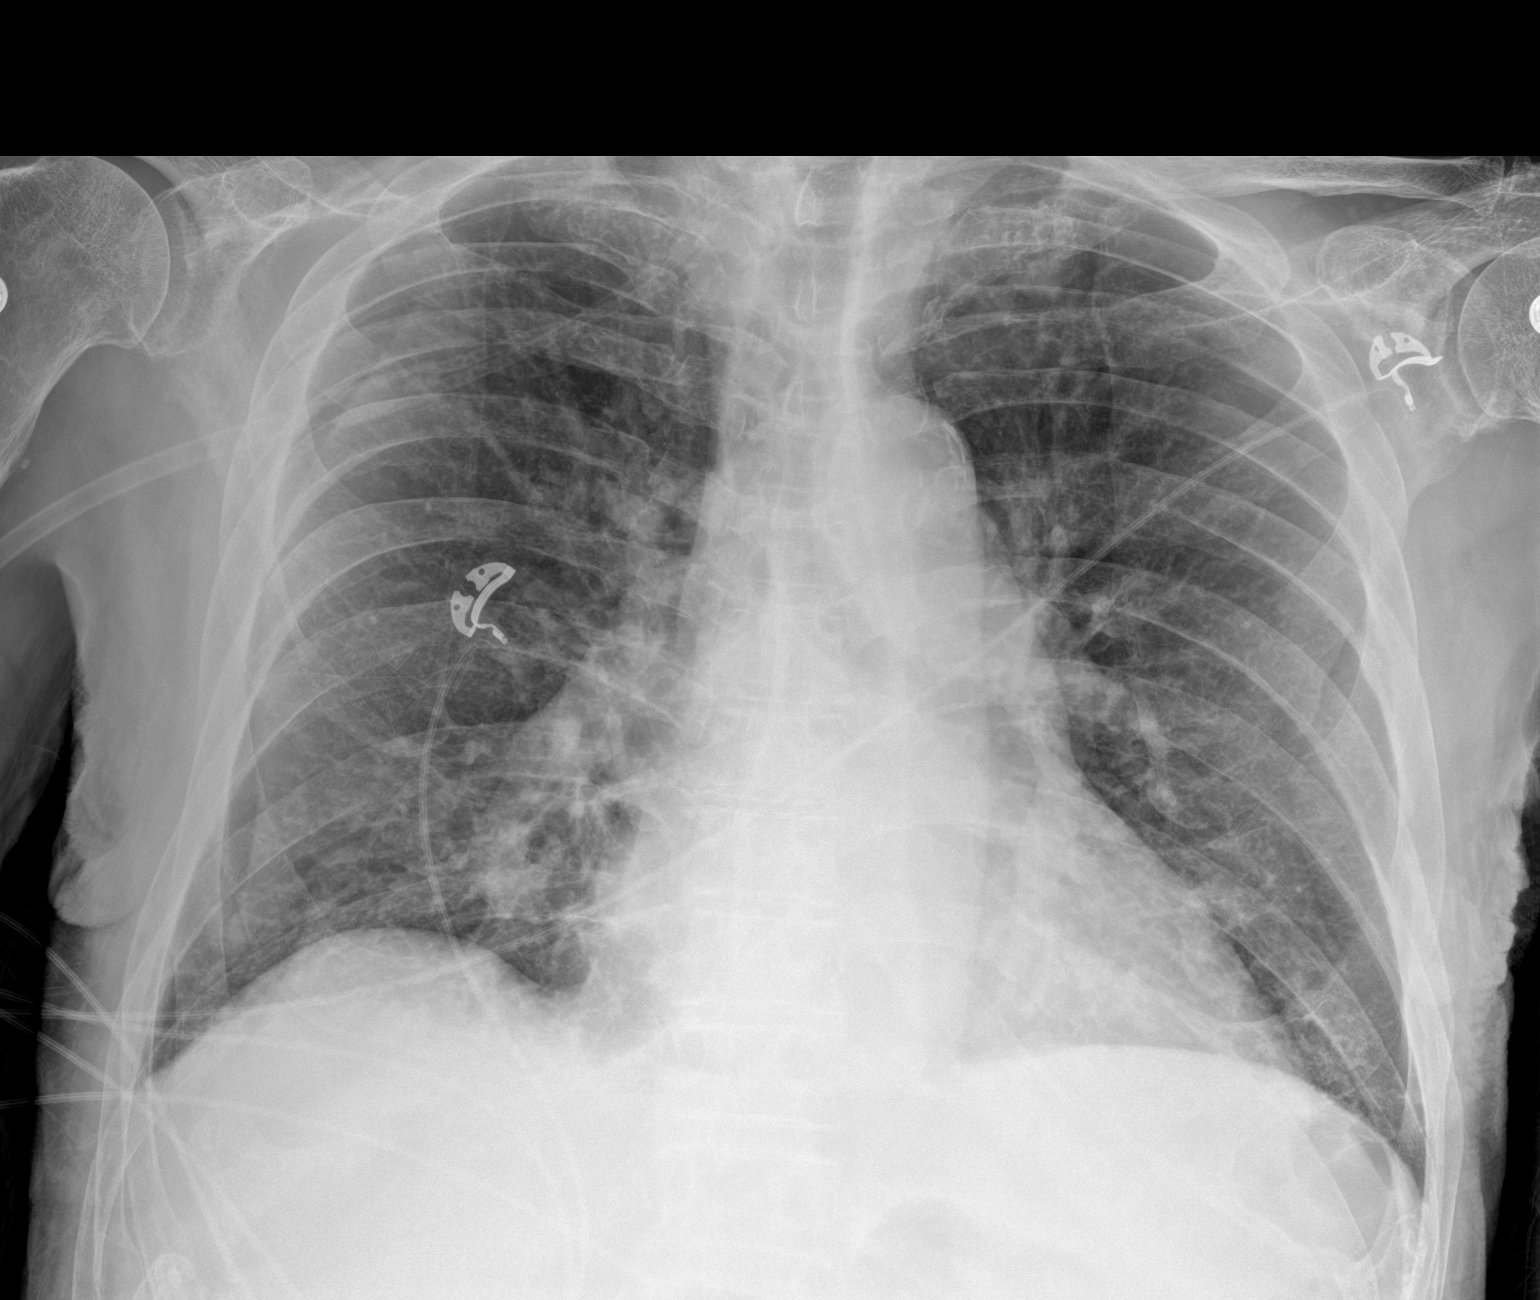

[1 of 1 positions shown; findings below may reference images not displayed]

FINDINGS: Stable cardiac silhouette.

Linear/reticular lung base opacities are noted consistent with
scarring, atelectasis or a combination, also stable. Lungs otherwise
clear.

No convincing pleural effusion.  No pneumothorax.
IMPRESSION: 1. No acute cardiopulmonary disease and no change from the previous
day's exam.

## 2022-08-01 ENCOUNTER — Telehealth: Payer: Self-pay | Admitting: Family Medicine

## 2022-08-01 ENCOUNTER — Other Ambulatory Visit: Payer: Self-pay | Admitting: *Deleted

## 2022-08-01 DIAGNOSIS — N3289 Other specified disorders of bladder: Secondary | ICD-10-CM

## 2022-08-01 MED ORDER — TROSPIUM CHLORIDE 20 MG PO TABS: 20.0000 mg | ORAL_TABLET | Freq: Every day | ORAL | 3 refills | Status: AC

## 2022-08-01 NOTE — Telephone Encounter (Signed)
Patient's daughter called triage line and states patient needs refill of Trospium. I called [name] back to let her know the Trospium was sent to [facility] in April with 11 refills. He is not due for refills at this time.

## 2022-09-03 ENCOUNTER — Other Ambulatory Visit: Payer: Self-pay | Admitting: Physician Assistant

## 2022-09-03 DIAGNOSIS — N401 Enlarged prostate with lower urinary tract symptoms: Secondary | ICD-10-CM

## 2022-09-05 ENCOUNTER — Other Ambulatory Visit: Payer: Self-pay

## 2022-09-05 DIAGNOSIS — N401 Enlarged prostate with lower urinary tract symptoms: Secondary | ICD-10-CM

## 2022-09-05 MED ORDER — FINASTERIDE 5 MG PO TABS: 5.0000 mg | ORAL_TABLET | Freq: Every day | ORAL | 3 refills | Status: AC

## 2022-09-05 NOTE — Telephone Encounter (Signed)
RX sent in by James Mata advised

## 2022-09-05 NOTE — Telephone Encounter (Signed)
Spoke with [name], patient's daughter, stating patient needs refill on Finasteride. Patient has [place] come out every 3 weeks and get SP cath change done. Does patient need a follow up here? Ok to refill?

## 2023-05-05 ENCOUNTER — Telehealth: Payer: Self-pay | Admitting: Physician Assistant

## 2023-05-05 ENCOUNTER — Other Ambulatory Visit: Payer: Self-pay | Admitting: Physician Assistant

## 2023-05-05 DIAGNOSIS — N3289 Other specified disorders of bladder: Secondary | ICD-10-CM

## 2023-05-05 NOTE — Telephone Encounter (Signed)
Spoke with daughter and she will try and get his PCP to fill the medication which will save her a visit. Pt last seen 02/2022

## 2023-05-05 NOTE — Telephone Encounter (Signed)
Pt's daughter [id number] that pharmacy will not refill Trospium for pt.

## 2023-07-25 ENCOUNTER — Emergency Department: Payer: Medicare PPO

## 2023-07-25 ENCOUNTER — Emergency Department
Admission: EM | Admit: 2023-07-25 | Discharge: 2023-07-26 | Disposition: A | Discharge status: Home / Self Care | Payer: Medicare PPO | Attending: Emergency Medicine | Admitting: Emergency Medicine

## 2023-07-25 DIAGNOSIS — M7989 Other specified soft tissue disorders: Secondary | ICD-10-CM | POA: Insufficient documentation

## 2023-07-25 DIAGNOSIS — R109 Unspecified abdominal pain: Secondary | ICD-10-CM | POA: Diagnosis present

## 2023-07-25 DIAGNOSIS — I509 Heart failure, unspecified: Secondary | ICD-10-CM | POA: Diagnosis not present

## 2023-07-25 DIAGNOSIS — I110 Hypertensive heart disease with heart failure: Secondary | ICD-10-CM | POA: Insufficient documentation

## 2023-07-25 LAB — BASIC METABOLIC PANEL
Anion gap: 13 (ref 5–15)
BUN: 18 mg/dL (ref 8–23)
CO2: 20 mmol/L — ABNORMAL LOW (ref 22–32)
Calcium: 8.7 mg/dL — ABNORMAL LOW (ref 8.9–10.3)
Chloride: 107 mmol/L (ref 98–111)
Creatinine, Ser: 1.14 mg/dL (ref 0.61–1.24)
GFR, Estimated: >60 mL/min (ref >60)
Glucose, Bld: 111 mg/dL — ABNORMAL HIGH (ref 70–99)
Potassium: 3 mmol/L — ABNORMAL LOW (ref 3.5–5.1)
Sodium: 140 mmol/L (ref 135–145)

## 2023-07-25 LAB — URINALYSIS, ROUTINE W REFLEX MICROSCOPIC
Bilirubin Urine: NEGATIVE
Glucose, UA: NEGATIVE mg/dL
Hgb urine dipstick: NEGATIVE
Ketones, ur: NEGATIVE mg/dL
Nitrite: NEGATIVE
Protein, ur: 100 mg/dL — AB
Specific Gravity, Urine: 1.015 (ref 1.005–1.030)
pH: 8 (ref 5.0–8.0)

## 2023-07-25 LAB — BRAIN NATRIURETIC PEPTIDE: B Natriuretic Peptide: 594.4 pg/mL — ABNORMAL HIGH (ref 0.0–100.0)

## 2023-07-25 LAB — CBC WITH DIFFERENTIAL/PLATELET
Abs Immature Granulocytes: 0.07 10*3/uL (ref 0.00–0.07)
Basophils Absolute: 0 10*3/uL (ref 0.0–0.1)
Basophils Relative: 0 %
Eosinophils Absolute: 0.2 10*3/uL (ref 0.0–0.5)
Eosinophils Relative: 2 %
HCT: 40.2 % (ref 39.0–52.0)
Hemoglobin: 13.6 g/dL (ref 13.0–17.0)
Immature Granulocytes: 1 %
Lymphocytes Relative: 19 %
Lymphs Abs: 1.7 10*3/uL (ref 0.7–4.0)
MCH: 33.7 pg (ref 26.0–34.0)
MCHC: 33.8 g/dL (ref 30.0–36.0)
MCV: 99.8 fL (ref 80.0–100.0)
Monocytes Absolute: 0.4 10*3/uL (ref 0.1–1.0)
Monocytes Relative: 4 %
Neutro Abs: 6.8 10*3/uL (ref 1.7–7.7)
Neutrophils Relative %: 74 %
Platelets: 127 10*3/uL — ABNORMAL LOW (ref 150–400)
RBC: 4.03 MIL/uL — ABNORMAL LOW (ref 4.22–5.81)
RDW: 14.9 % (ref 11.5–15.5)
WBC: 9.2 10*3/uL (ref 4.0–10.5)
nRBC: 0 % (ref 0.0–0.2)

## 2023-07-25 LAB — TROPONIN I (HIGH SENSITIVITY): Troponin I (High Sensitivity): 16 ng/L (ref <18)

## 2023-07-25 MED ORDER — CIPROFLOXACIN HCL 500 MG PO TABS
500.0000 mg | ORAL_TABLET | Freq: Once | ORAL | Status: AC
  Administered 2023-07-25: 500 mg via ORAL
  Filled 2023-07-25: qty 1

## 2023-07-25 NOTE — ED Notes (Signed)
Pt refused EKG.

## 2023-07-25 NOTE — ED Notes (Signed)
 Pt cont to refuse EKG, 2nd troponin and u/s; explained to pt & daughter importance of having such done but pt cont to refuse; pt is A&Ox3 and daughter voices understanding as well

## 2023-07-25 NOTE — ED Triage Notes (Signed)
 Pt here with daughter for back pain and left leg swollen making it difficult to stand. Pain now in the hip. Left leg +2 edema, right now +1 edema. Pt has suprapubic catheter without complications.

## 2023-07-25 NOTE — ED Provider Triage Note (Signed)
 Emergency Medicine Provider Triage Evaluation Note  James Mata , a 89 y.o. male  was evaluated in triage.  Pt complains of leg swelling. No history of PE/DVT. Reports pain in his legs and trouble walking. Went to [facility] and sent to ER.   Review of Systems  Positive: Leg pain and swelling Negative: CP/SOB  Physical Exam  There were no vitals taken for this visit. Gen:   Awake, no distress   Resp:  Normal effort  MSK:   Moves extremities without difficulty  Other:    Medical Decision Making  Medically screening exam initiated at 2:51 PM.  Appropriate orders placed.  James Mata was informed that the remainder of the evaluation will be completed by another provider, this initial triage assessment does not replace that evaluation, and the importance of remaining in the ED until their evaluation is complete.     Special Ranes E, PA-C 07/25/23 1454

## 2023-07-25 NOTE — ED Notes (Signed)
 Patient transported to CT

## 2023-07-26 LAB — TROPONIN I (HIGH SENSITIVITY): Troponin I (High Sensitivity): 42 ng/L — ABNORMAL HIGH (ref <18)

## 2023-07-26 MED ORDER — ACETAMINOPHEN 500 MG PO TABS
1000.0000 mg | ORAL_TABLET | Freq: Once | ORAL | Status: AC
  Administered 2023-07-26: 1000 mg via ORAL
  Filled 2023-07-26: qty 2

## 2023-07-26 MED ORDER — CIPROFLOXACIN HCL 500 MG PO TABS: 500.0000 mg | ORAL_TABLET | Freq: Two times a day (BID) | ORAL | 0 refills | Status: AC

## 2023-07-26 MED ORDER — CIPROFLOXACIN HCL 500 MG PO TABS: 500.0000 mg | ORAL_TABLET | Freq: Two times a day (BID) | ORAL | 0 refills | Status: DC

## 2023-07-26 MED ORDER — POTASSIUM CHLORIDE CRYS ER 20 MEQ PO TBCR
40.0000 meq | EXTENDED_RELEASE_TABLET | Freq: Once | ORAL | Status: AC
  Administered 2023-07-26: 40 meq via ORAL
  Filled 2023-07-26: qty 2

## 2023-07-26 MED ORDER — KETOROLAC TROMETHAMINE 15 MG/ML IJ SOLN
15.0000 mg | Freq: Once | INTRAMUSCULAR | Status: AC
  Administered 2023-07-26: 15 mg via INTRAMUSCULAR
  Filled 2023-07-26: qty 1

## 2023-07-26 NOTE — ED Provider Notes (Addendum)
 [facility] Provider Note    Event Date/Time   First MD Initiated Contact with Patient 07/25/23 2304     (approximate)   History   Hip Pain and Leg Swelling   HPI  James Mata is a 89 y.o. male   Past medical history of CHF, hypertension, hyperlipidemia, enlarged prostate with chronic indwelling suprapubic catheter who presents to the emergency department with right-sided flank pain.  Atraumatic and started a couple days ago, worsening.  Denies fevers or chills.  No abdominal pain.  He has chronic longstanding lower extremity edema with left greater than right, unchanged.  Denies shortness of breath.  No chest pain.  No other acute medical complaints.  Independent Historian contributed to assessment above: His daughter is at bedside to corroborate information and past medical history as above    Physical Exam   Triage Vital Signs: ED Triage Vitals  Encounter Vitals Group     BP 07/25/23 1453 (!) 160/56     Systolic BP Percentile --      Diastolic BP Percentile --      Pulse Rate 07/25/23 1453 63     Resp 07/25/23 1453 18     Temp 07/25/23 1453 (!) 97.5 F (36.4 C)     Temp Source 07/25/23 1453 Oral     SpO2 07/25/23 1453 97 %     Weight --      Height --      Head Circumference --      Peak Flow --      Pain Score 07/25/23 1452 5     Pain Loc --      Pain Education --      Exclude from Growth Chart --     Most recent vital signs: Vitals:   07/25/23 2357 07/26/23 0202  BP: (!) 148/43   Pulse: (!) 56   Resp: 16   Temp:  98.4 F (36.9 C)  SpO2: 92%     General: Awake, no distress.  CV:  Good peripheral perfusion.  Resp:  Normal effort.  Abd:  No distention.  Other:  Some mild right CVA tenderness.  No midline lumbar tenderness step-off or deformity.  Soft benign abdominal exam without rigidity or guarding.  Hard of hearing.  Nontoxic comfortable appearance with normal vital signs.  Afebrile.   ED Results / Procedures /  Treatments   Labs (all labs ordered are listed, but only abnormal results are displayed) Labs Reviewed  BASIC METABOLIC PANEL - Abnormal; Notable for the following components:      Result Value   Potassium 3.0 (*)    CO2 20 (*)    Glucose, Bld 111 (*)    Calcium 8.7 (*)    All other components within normal limits  CBC WITH DIFFERENTIAL/PLATELET - Abnormal; Notable for the following components:   RBC 4.03 (*)    Platelets 127 (*)    All other components within normal limits  BRAIN NATRIURETIC PEPTIDE - Abnormal; Notable for the following components:   B Natriuretic Peptide 594.4 (*)    All other components within normal limits  URINALYSIS, ROUTINE W REFLEX MICROSCOPIC - Abnormal; Notable for the following components:   Color, Urine AMBER (*)    APPearance CLOUDY (*)    Protein, ur 100 (*)    Leukocytes,Ua MODERATE (*)    Bacteria, UA FEW (*)    All other components within normal limits  TROPONIN I (HIGH SENSITIVITY) - Abnormal; Notable for the following components:  Troponin I (High Sensitivity) 42 (*)    All other components within normal limits  URINE CULTURE  TROPONIN I (HIGH SENSITIVITY)     I ordered and reviewed the above labs they are notable for cell counts and electrolytes largely unremarkable except for some mild low potassium  ED ECG REPORT I, Ginnie Shams, the attending physician, personally viewed and interpreted this ECG.   Date: 07/26/2023  EKG Time: 0053  Rate: 54  Rhythm: sinus  Axis: nl  Intervals:1st deg av block  ST&T Change: no stemi    RADIOLOGY I independently reviewed and interpreted chest x-ray and I see no obvious focality or pneumothorax I also reviewed radiologist's formal read.   PROCEDURES:  Critical Care performed: No  Procedures   MEDICATIONS ORDERED IN ED: Medications  ketorolac  (TORADOL ) 15 MG/ML injection 15 mg (has no administration in time range)  acetaminophen  (TYLENOL ) tablet 1,000 mg (has no administration in time  range)  ciprofloxacin  (CIPRO ) tablet 500 mg (500 mg Oral Given 07/25/23 2355)  potassium chloride  SA (KLOR-CON  M) CR tablet 40 mEq (40 mEq Oral Given 07/26/23 0100)   IMPRESSION / MDM / ASSESSMENT AND PLAN / ED COURSE  I reviewed the triage vital signs and the nursing notes.                                Patient's presentation is most consistent with acute presentation with potential threat to life or bodily function.  Differential diagnosis includes, but is not limited to, urinary tract infection, pyelonephritis, renal colic/kidney stone, musculoskeletal pain considered but less likely vascular emergency   The patient is on the cardiac monitor to evaluate for evidence of arrhythmia and/or significant heart rate changes.  MDM:    Right sided flank pain atraumatic in this patient with chronic indwelling suprapubic catheter.  Urinalysis difficult to interpret in the setting of his catheter but shows some bacteria and some inflammatory changes will empirically treat with ciprofloxacin  in case this is representing a urinary tract infection and send culture.  CT scan ordered as renal colic/stone is on the differential as well.  Otherwise labs unremarkable except for mildly low potassium for which repletion was ordered.  Benign abdominal exam rules against other surgical abdominal pathologies at this time.   He has chronic lower extremity edema and left greater than right, unchanged and not bothersome today.  He refused a DVT ultrasound which I think is appropriate given his longstanding symptoms and chief complaint tonight of right flank pain.   -- CT scan with questionable L1 fracture, incidental adrenal mass, no kidney stone.  He has no midline tenderness no trauma and no neurologic deficits, we will have him be treated for suspected urinary tract infection, follow-up with spine specialist.  He had a very small bump in his troponin from 16-40 and he has no chest pain whatsoever presents  emergency.  I doubt this represents cardiac ischemia or cardiac emergency at this time.  Mildly elevated BNP with no shortness of breath or respiratory symptoms as well.  May be mild elevation in the setting of demand ischemia suspected UTI.  He is very eager to go home.  He does not appear septic.  Will treat with antibiotics, send urine culture, follow-up with urologist, and return with any new or worsening symptoms.   FINAL CLINICAL IMPRESSION(S) / ED DIAGNOSES   Final diagnoses:  Right flank pain     Rx / DC Orders  ED Discharge Orders          Ordered    ciprofloxacin  (CIPRO ) 500 MG tablet  2 times daily        07/26/23 0245             Note:  This document was prepared using Dragon voice recognition software and may include unintentional dictation errors.    Cyrena Mylar, MD 07/26/23 [id number]    Cyrena Mylar, MD 07/26/23 9941    Cyrena Mylar, MD 07/26/23 [phone]

## 2023-07-26 NOTE — ED Notes (Signed)
 Patient verbalizes understanding of discharge instructions. Opportunity for questioning and answers were provided.  Prescriptions reviewed and pharmacy verified.   Patient discharged with daughter.

## 2023-07-26 NOTE — Discharge Instructions (Addendum)
 Take antibiotic medications as prescribed and follow-up with your urologist this week to review the results of your urine culture.  Take Aleve 500 mg once in the morning and once at night.  Take Tylenol  650 mg every 6-8 hours with food.  Your CT scan had some incidental findings including a adrenal mass for which she should follow-up with your regular doctor for repeat testing as needed.  It also showed a questionable fracture of your lumbar spine that could also account for some of your pain.  You can call Dr. Katrina who is a spine specialist for further evaluation and treatment as needed.   Thank you for choosing us  for your health care today!  Please see your primary doctor this week for a follow up appointment.   If you have any new, worsening, or unexpected symptoms call your doctor right away or come back to the emergency department for reevaluation.  It was my pleasure to care for you today.   Ginnie EDISON Cyrena, MD

## 2023-07-27 ENCOUNTER — Other Ambulatory Visit: Payer: Self-pay | Admitting: Physician Assistant

## 2023-07-27 DIAGNOSIS — E278 Other specified disorders of adrenal gland: Secondary | ICD-10-CM

## 2023-07-27 NOTE — Progress Notes (Signed)
 Incidental, indeterminate 18mm left adrenal mass on CT stone study. Will order CT abdomen with and without contrast for further evaluation and schedule office visit follow up with Dr. Lonna Cobb.

## 2023-07-30 LAB — URINE CULTURE: Culture: >=100000 — AB

## 2023-08-10 ENCOUNTER — Ambulatory Visit (INDEPENDENT_AMBULATORY_CARE_PROVIDER_SITE_OTHER): Payer: Medicare HMO | Admitting: Physician Assistant

## 2023-08-10 VITALS — BP 124/68 | HR 70 | Ht 70.0 in | Wt 150.0 lb

## 2023-08-10 DIAGNOSIS — R339 Retention of urine, unspecified: Secondary | ICD-10-CM | POA: Diagnosis not present

## 2023-08-10 NOTE — Progress Notes (Signed)
08/10/2023 5:07 PM   James Mata 1930-10-05 [id number]  CC: Chief Complaint  Patient presents with   Recurrent UTI   HPI: James Mata is a 89 y.o. male with PMH BPH with urinary retention managed with suprapubic catheter, elevated PSA with benign biopsy in the 1990s, bladder neck contracture, and high risk hematuria with benign cystoscopy in 2022 who presents today for ED follow-up of possible UTI.  He is accompanied today by his daughter, who contributes to HPI.  Notably, since I last saw him in clinic his son-in-law passed away.   He was seen in the ED on 07/26/2023 with reports of right hip pain and chronic LLE edema.  CT stone study showed prostatomegaly, possible acute L1 fracture, and an indeterminate left adrenal nodule.  UA was positive consistent with his chronic catheterization and he was started on empiric Cipro for possible UTI.  Urine culture ultimately grew MDR Morganella morganii, MDR Proteus mirabilis, and E faecalis.  On outpatient follow-up, his PCP switched him to Keflex 500 mg 3 times daily x 7 days.  Today he reports he thinks he still has a urinary infection.  Symptoms include right hip pain and midline low back pain that is worse when he lays on his right side.  He denies fever, nausea, or vomiting.  He was recommended to follow-up with Dr. Myer Haff regarding his spine findings on imaging.  I have ordered a CT abdomen with and without contrast for further evaluation of his adrenal nodule.  PMH: Past Medical History:  Diagnosis Date   CHF (congestive heart failure) (HCC)    Complication of anesthesia    had trouble waking up in 2014   Enlarged prostate    Hyperlipidemia    Hypertension     Surgical History: Past Surgical History:  Procedure Laterality Date   CORONARY ANGIOPLASTY WITH STENT PLACEMENT     CYSTOSCOPY N/A 08/11/2020   Procedure: CYSTOSCOPY;  Surgeon: Riki Altes, MD;  Location: [facility] OR[facility];  Service: Urology;  Laterality: N/A;    CYSTOSCOPY WITH DIRECT VISION INTERNAL URETHROTOMY N/A 08/11/2020   Procedure: CYSTOSCOPY WITH DIRECT VISION INTERNAL URETHROTOMY;  Surgeon: Riki Altes, MD;  Location: [facility];  Service: Urology;  Laterality: N/A;   CYSTOSCOPY WITH URETHRAL DILATATION N/A 08/11/2020   Procedure: CYSTOSCOPY WITH URETHRAL DILATATION;  Surgeon: Riki Altes, MD;  Location: [facility];  Service: Urology;  Laterality: N/A;   HERNIA REPAIR     PROSTATE ABLATION      Home Medications:  Allergies as of 08/10/2023   No Known Allergies      Medication List        Accurate as of August 10, 2023  5:07 PM. If you have any questions, ask your nurse or doctor.          STOP taking these medications    cefUROXime 500 MG tablet Commonly known as: CEFTIN       TAKE these medications    acetaminophen 500 MG tablet Commonly known as: TYLENOL Take 500 mg by mouth every 6 (six) hours as needed for mild pain, moderate pain, headache or fever. Take with tramadol in the morning   aspirin EC 81 MG tablet Take 81 mg by mouth daily.   cyclobenzaprine 5 MG tablet Commonly known as: FLEXERIL Take 5 mg by mouth at bedtime.   doxazosin 4 MG tablet Commonly known as: CARDURA Take 4 mg by mouth at bedtime.   finasteride 5 MG tablet Commonly known as: PROSCAR  Take 1 tablet (5 mg total) by mouth daily.   furosemide 80 MG tablet Commonly known as: LASIX Take 80 mg by mouth 2 (two) times daily. AM WITH BREAKFAST, AND THEN 1 AT [facility]   isosorbide mononitrate 60 MG 24 hr tablet Commonly known as: IMDUR Take 30 mg by mouth daily.   metoprolol succinate 25 MG 24 hr tablet Commonly known as: TOPROL-XL Take 12.5 mg by mouth daily.   multivitamin with minerals tablet Take 1 tablet by mouth daily.   nitroGLYCERIN 0.4 MG SL tablet Commonly known as: NITROSTAT Place 0.4 mg under the tongue every 5 (five) minutes as needed for chest pain.   traMADol 50 MG tablet Commonly known as: ULTRAM Take 50  mg by mouth 2 (two) times daily as needed for severe pain.   trospium 20 MG tablet Commonly known as: SANCTURA Take 1 tablet (20 mg total) by mouth daily.        Allergies:  No Known Allergies  Family History: No family history on file.  Social History:   reports that he has never smoked. He has never used smokeless tobacco. He reports that he does not currently use alcohol. He reports that he does not currently use drugs.  Physical Exam: BP 124/68   Pulse 70   Ht 5\' 10"  (1.778 m)   Wt 150 lb (68 kg)   BMI 21.52 kg/m   Constitutional:  Alert and oriented, no acute distress, nontoxic appearing HEENT: [place], AT Cardiovascular: No clubbing, cyanosis, or edema Respiratory: Normal respiratory effort, no increased work of breathing Skin: No rashes, bruises or suspicious lesions Neurologic: Grossly intact, no focal deficits, moving all 4 extremities Psychiatric: Normal mood and affect  Assessment & Plan:   1. Urinary retention (Primary) Continue SP tube with monthly exchanges by home health.  I do not think his persistent midline low back pain and right hip pain that are exacerbated with positioning are indicative of UTI, and agree with follow-up with neurosurgery regarding his possible L1 fracture as seen on CT.  Keep plans for CT abdomen for further evaluation of adrenal nodule and follow-up with Dr. Lonna Cobb as previously scheduled.  Return for Keep follow-up as scheduled.  Carman Ching, PA-C  [facility] [place] [address] [place], [place] [address] [phone]

## 2023-08-25 ENCOUNTER — Ambulatory Visit: Payer: Medicare PPO | Admitting: Urology

## 2023-09-20 DIAGNOSIS — M79675 Pain in left toe(s): Secondary | ICD-10-CM | POA: Diagnosis not present

## 2023-09-20 DIAGNOSIS — B351 Tinea unguium: Secondary | ICD-10-CM | POA: Diagnosis not present

## 2023-09-20 DIAGNOSIS — J449 Chronic obstructive pulmonary disease, unspecified: Secondary | ICD-10-CM | POA: Diagnosis not present

## 2023-09-20 DIAGNOSIS — I480 Paroxysmal atrial fibrillation: Secondary | ICD-10-CM | POA: Diagnosis not present

## 2023-09-20 DIAGNOSIS — L600 Ingrowing nail: Secondary | ICD-10-CM | POA: Diagnosis not present

## 2023-09-20 DIAGNOSIS — I2510 Atherosclerotic heart disease of native coronary artery without angina pectoris: Secondary | ICD-10-CM | POA: Diagnosis not present

## 2023-09-20 DIAGNOSIS — I739 Peripheral vascular disease, unspecified: Secondary | ICD-10-CM | POA: Diagnosis not present

## 2023-09-20 DIAGNOSIS — M79674 Pain in right toe(s): Secondary | ICD-10-CM | POA: Diagnosis not present

## 2023-09-20 DIAGNOSIS — I10 Essential (primary) hypertension: Secondary | ICD-10-CM | POA: Diagnosis not present

## 2023-09-20 DIAGNOSIS — Z960 Presence of urogenital implants: Secondary | ICD-10-CM | POA: Diagnosis not present

## 2023-09-20 DIAGNOSIS — R7302 Impaired glucose tolerance (oral): Secondary | ICD-10-CM | POA: Diagnosis not present

## 2023-09-25 DIAGNOSIS — R399 Unspecified symptoms and signs involving the genitourinary system: Secondary | ICD-10-CM | POA: Diagnosis not present

## 2023-10-18 DIAGNOSIS — R399 Unspecified symptoms and signs involving the genitourinary system: Secondary | ICD-10-CM | POA: Diagnosis not present

## 2023-11-06 DIAGNOSIS — N390 Urinary tract infection, site not specified: Secondary | ICD-10-CM | POA: Diagnosis not present

## 2023-11-15 DIAGNOSIS — R399 Unspecified symptoms and signs involving the genitourinary system: Secondary | ICD-10-CM | POA: Diagnosis not present

## 2023-11-27 DIAGNOSIS — H9193 Unspecified hearing loss, bilateral: Secondary | ICD-10-CM | POA: Diagnosis not present

## 2023-11-30 ENCOUNTER — Ambulatory Visit
Admission: RE | Admit: 2023-11-30 | Discharge: 2023-11-30 | Disposition: A | Discharge status: Home / Self Care | Source: Ambulatory Visit | Attending: Student | Admitting: Student

## 2023-11-30 ENCOUNTER — Other Ambulatory Visit: Payer: Self-pay | Admitting: Student

## 2023-11-30 DIAGNOSIS — S0990XA Unspecified injury of head, initial encounter: Secondary | ICD-10-CM | POA: Diagnosis not present

## 2023-11-30 DIAGNOSIS — H9190 Unspecified hearing loss, unspecified ear: Secondary | ICD-10-CM | POA: Diagnosis not present

## 2023-11-30 DIAGNOSIS — H903 Sensorineural hearing loss, bilateral: Secondary | ICD-10-CM | POA: Diagnosis not present

## 2023-11-30 DIAGNOSIS — H547 Unspecified visual loss: Secondary | ICD-10-CM | POA: Diagnosis not present

## 2023-11-30 DIAGNOSIS — H90A21 Sensorineural hearing loss, unilateral, right ear, with restricted hearing on the contralateral side: Secondary | ICD-10-CM | POA: Diagnosis not present

## 2023-11-30 DIAGNOSIS — H6123 Impacted cerumen, bilateral: Secondary | ICD-10-CM | POA: Diagnosis not present

## 2024-02-14 DIAGNOSIS — R8290 Unspecified abnormal findings in urine: Secondary | ICD-10-CM | POA: Diagnosis not present

## 2024-02-14 DIAGNOSIS — R399 Unspecified symptoms and signs involving the genitourinary system: Secondary | ICD-10-CM | POA: Diagnosis not present

## 2024-02-19 ENCOUNTER — Telehealth: Payer: Self-pay

## 2024-02-19 NOTE — Telephone Encounter (Signed)
 This is the first I'm hearing about a UA on him.  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  I need more information about what symptoms he is having so we can interpret any urine testing.  They may fax us  any results they have for review.

## 2024-02-19 NOTE — Telephone Encounter (Signed)
 A nurse for [facility] and would like to know what the plan of action will be with the positive UA that came back on the patient. UA was sent off by them and they would like a return call.

## 2024-02-21 NOTE — Telephone Encounter (Signed)
 Resolved miscommunication. Daughter let us  know that he is being taken care of by his primary at [facility] and that the home health nurse called us  in error. And he has been treated for his infection.

## 2024-02-26 ENCOUNTER — Telehealth: Payer: Self-pay

## 2024-02-26 NOTE — Telephone Encounter (Signed)
 LVM for [facility] to return call. They faxed over some paper work asking for a follow up, called to get more clarification.

## 2024-03-19 DIAGNOSIS — R399 Unspecified symptoms and signs involving the genitourinary system: Secondary | ICD-10-CM | POA: Diagnosis not present

## 2024-03-20 DIAGNOSIS — I2510 Atherosclerotic heart disease of native coronary artery without angina pectoris: Secondary | ICD-10-CM | POA: Diagnosis not present

## 2024-03-20 DIAGNOSIS — Z1331 Encounter for screening for depression: Secondary | ICD-10-CM | POA: Diagnosis not present

## 2024-03-20 DIAGNOSIS — Z0001 Encounter for general adult medical examination with abnormal findings: Secondary | ICD-10-CM | POA: Diagnosis not present

## 2024-03-20 DIAGNOSIS — Z0000 Encounter for general adult medical examination without abnormal findings: Secondary | ICD-10-CM | POA: Diagnosis not present

## 2024-03-20 DIAGNOSIS — M79675 Pain in left toe(s): Secondary | ICD-10-CM | POA: Diagnosis not present

## 2024-03-20 DIAGNOSIS — I739 Peripheral vascular disease, unspecified: Secondary | ICD-10-CM | POA: Diagnosis not present

## 2024-03-20 DIAGNOSIS — B351 Tinea unguium: Secondary | ICD-10-CM | POA: Diagnosis not present

## 2024-03-20 DIAGNOSIS — R7302 Impaired glucose tolerance (oral): Secondary | ICD-10-CM | POA: Diagnosis not present

## 2024-03-20 DIAGNOSIS — J449 Chronic obstructive pulmonary disease, unspecified: Secondary | ICD-10-CM | POA: Diagnosis not present

## 2024-03-20 DIAGNOSIS — N400 Enlarged prostate without lower urinary tract symptoms: Secondary | ICD-10-CM | POA: Diagnosis not present

## 2024-03-20 DIAGNOSIS — M79674 Pain in right toe(s): Secondary | ICD-10-CM | POA: Diagnosis not present

## 2024-03-20 DIAGNOSIS — I10 Essential (primary) hypertension: Secondary | ICD-10-CM | POA: Diagnosis not present

## 2024-03-20 DIAGNOSIS — I480 Paroxysmal atrial fibrillation: Secondary | ICD-10-CM | POA: Diagnosis not present

## 2024-04-30 DIAGNOSIS — Z23 Encounter for immunization: Secondary | ICD-10-CM | POA: Diagnosis not present

## 2024-05-13 ENCOUNTER — Other Ambulatory Visit: Payer: Self-pay

## 2024-05-13 ENCOUNTER — Emergency Department
Admission: EM | Admit: 2024-05-13 | Discharge: 2024-05-13 | Disposition: A | Discharge status: Home / Self Care | Attending: Emergency Medicine | Admitting: Emergency Medicine

## 2024-05-13 DIAGNOSIS — T83010A Breakdown (mechanical) of cystostomy catheter, initial encounter: Secondary | ICD-10-CM

## 2024-05-13 DIAGNOSIS — T83018A Breakdown (mechanical) of other indwelling urethral catheter, initial encounter: Secondary | ICD-10-CM | POA: Insufficient documentation

## 2024-05-13 DIAGNOSIS — N3000 Acute cystitis without hematuria: Secondary | ICD-10-CM | POA: Insufficient documentation

## 2024-05-13 DIAGNOSIS — Y732 Prosthetic and other implants, materials and accessory gastroenterology and urology devices associated with adverse incidents: Secondary | ICD-10-CM | POA: Diagnosis not present

## 2024-05-13 DIAGNOSIS — R300 Dysuria: Secondary | ICD-10-CM | POA: Diagnosis not present

## 2024-05-13 DIAGNOSIS — T83091A Other mechanical complication of indwelling urethral catheter, initial encounter: Secondary | ICD-10-CM | POA: Diagnosis not present

## 2024-05-13 LAB — URINALYSIS, ROUTINE W REFLEX MICROSCOPIC
Bilirubin Urine: NEGATIVE
Glucose, UA: NEGATIVE mg/dL
Ketones, ur: NEGATIVE mg/dL
Nitrite: NEGATIVE
Protein, ur: NEGATIVE mg/dL
Specific Gravity, Urine: 1.009 (ref 1.005–1.030)
WBC, UA: >50 WBC/hpf (ref 0–5)
pH: 5 (ref 5.0–8.0)

## 2024-05-13 MED ORDER — CEPHALEXIN 500 MG PO CAPS: 500.0000 mg | ORAL_CAPSULE | Freq: Two times a day (BID) | ORAL | 0 refills | Status: AC

## 2024-05-13 NOTE — ED Triage Notes (Signed)
 Pt to ED via [facility] from home. Pt has home health nurse that was there to replace suprapubic catheter. RN was unable to place catheter back and pt was sent to ED. Pt denies pain.

## 2024-05-13 NOTE — Discharge Instructions (Signed)
 Please let home health know that the suprapubic catheter has been changed to size of 14.  Give the antibiotic as prescribed and until finished.  Return to the emergency department for symptoms of change, worsen, or for new concerns if unable to see primary care.

## 2024-05-13 NOTE — ED Triage Notes (Signed)
 First Nurse Note: Patient to ED via [facility] from home for suprapubic catheter issue. PT has new home health RN that took catheter out but was unable to place it back in. PT ambulatory to EMS stretcher. Aox4 VS WNL

## 2024-05-13 NOTE — ED Provider Notes (Signed)
 [facility] Provider Note    Event Date/Time   First MD Initiated Contact with Patient 05/13/24 1529     (approximate)   History   No chief complaint on file.   HPI  James Mata is a 89 y.o. male with history of CAD, BPH with obstruction/lower urinary tract symptoms and as listed in EMR presents to the emergency department for replacement of the suprapubic catheter that was removed by home health nurse today about 2 hours prior to arrival and urinalysis. Home health nurse was unable to replace the catheter after she removed it. He reports it has always been a size 18. Since the catheter was removed, urine has been passing through his penis and he states it is burning. No back pain or fever. No feeling of urinary retention     Physical Exam    Vitals:   05/13/24 1450  BP: (!) 160/66  Pulse: (!) 58  Resp: 18  Temp: 98.4 F (36.9 C)  SpO2: 98%    General: Awake, no distress.  CV:  Good peripheral perfusion.  Resp:  Normal effort.  Abd:  No distention. Soft. Nontender. Other:     ED Results / Procedures / Treatments   Labs (all labs ordered are listed, but only abnormal results are displayed)  Labs Reviewed  URINALYSIS, ROUTINE W REFLEX MICROSCOPIC - Abnormal; Notable for the following components:      Result Value   Color, Urine YELLOW (*)    APPearance HAZY (*)    Hgb urine dipstick SMALL (*)    Leukocytes,Ua MODERATE (*)    Bacteria, UA FEW (*)    All other components within normal limits     EKG  Not indicated.   RADIOLOGY  Image and radiology report reviewed and interpreted by me. Radiology report consistent with the same.  Not indicated.  PROCEDURES:  Critical Care performed: No  SUPRAPUBIC TUBE PLACEMENT  Date/Time: 05/13/2024 5:15 PM  Performed by: Herlinda Kirk NOVAK, FNP Authorized by: Herlinda Kirk NOVAK, FNP   Consent:    Consent obtained:  Verbal   Consent given by:  Patient   Risks discussed:  Pain  (inability to replace) Universal protocol:    Patient identity confirmed:  Verbally with patient Procedure details:    Complexity:  Complex   Catheter size:  14 Fr   Number of attempts:  3   Urine characteristics:  Cloudy and yellow Post-procedure details:    Procedure completion:  Tolerated well, no immediate complications    MEDICATIONS ORDERED IN ED:  Medications - No data to display   IMPRESSION / MDM / ASSESSMENT AND PLAN / ED COURSE   I have reviewed the triage note and vital signs. Vital signs stable   Differential diagnosis includes, but is not limited to, suprapubic catheter complication, suprapubic stoma closure, UTI  Patient's presentation is most consistent with acute illness / injury with system symptoms.  89 year old male presents to the ER for treatment and evaluation after home health nurse was unable to replace his suprapubic catheter after removing it for a routine change. See HPI.  On exam, suprapubic stoma is open and urine is draining.  Abdomen is soft and nontender.  Unable to replace suprapubic catheter with 18 French coud.  24 [place] coud attempted as well.  14 [place] coud went and without resistance and immediately drained cloudy appearing yellow urine.  No bleeding postprocedure.  Urinalysis sent to lab and there is some indication of infection.  Moderate amount of leukocytes, greater than 50 white blood cells, and few bacteria.  Plan will be to treat him with 3 days of Keflex .  Daughter encouraged to call the home health agency to advise them of the new catheter size so they will be prepared when they come out in about 3 weeks to change it again.  We discussed follow-up with urology however daughter states that he does not see any urologist anymore and home health agency manages most of his medical care.  Primary care outpatient follow-up and ER return precautions discussed.      FINAL CLINICAL IMPRESSION(S) / ED DIAGNOSES   Final diagnoses:   Suprapubic catheter dysfunction, initial encounter  Acute cystitis without hematuria     Rx / DC Orders   ED Discharge Orders          Ordered    cephALEXin  (KEFLEX ) 500 MG capsule  2 times daily        05/13/24 1654             Note:  This document was prepared using Dragon voice recognition software and may include unintentional dictation errors.   Herlinda Kirk NOVAK, FNP 05/13/24 1718    Claudene Rover, MD 05/13/24 [phone]

## 2024-05-14 ENCOUNTER — Telehealth: Payer: Self-pay

## 2024-05-14 ENCOUNTER — Telehealth: Payer: Self-pay | Admitting: Physician Assistant

## 2024-05-14 DIAGNOSIS — R3989 Other symptoms and signs involving the genitourinary system: Secondary | ICD-10-CM

## 2024-05-14 MED ORDER — CEPHALEXIN 500 MG PO CAPS: 500.0000 mg | ORAL_CAPSULE | Freq: Two times a day (BID) | ORAL | 0 refills | Status: AC

## 2024-05-14 NOTE — Telephone Encounter (Signed)
 Pt's daughter called to let us  know he was seen in the ER due to a UTI.  They only gave him 500 mg of Keflex  for 3 days.  They also put in a 14 catheter and he usually has an 18.  The daughter, Dagoberto, said they usually have someone come out, get a sample, take it to his pcp, Dr. Rudolpho at [facility], he does a urinalysis and culture and sends in abx for a longer amount of time.  She had a bad experience with a travel nurse who was filling in for [facility] nurse, who took the catheter out, but should not have.  She should have just took urine and took to lab at [facility].  Dagoberto is concerned this isn't long enough for patient to be on antibiotic AND that this catheter, being the wrong size, will cause problems.  Please give her a call @ [phone]

## 2024-05-14 NOTE — Telephone Encounter (Signed)
 Extended patient from 3 to 7 days on Keflex . Will try to get patient in soon to try to upsize him back to his regular size catheter

## 2024-05-16 ENCOUNTER — Ambulatory Visit: Admitting: Physician Assistant

## 2024-05-16 DIAGNOSIS — R339 Retention of urine, unspecified: Secondary | ICD-10-CM

## 2024-05-16 DIAGNOSIS — Z435 Encounter for attention to cystostomy: Secondary | ICD-10-CM

## 2024-05-16 NOTE — Progress Notes (Signed)
 Suprapubic Cath Change  Patient is present today for a suprapubic catheter change due to urinary retention.  10ml of water was drained from the balloon, a 14FR coude foley cath was removed from the tract without difficulty.  Site was cleaned and prepped in a sterile fashion with betadine.  A 16FR coude foley cath was replaced into the tract no complications were noted. Urine return was noted, 10 ml of sterile water was inflated into the balloon and a night bag was attached for drainage.  Patient tolerated well.    Performed by: Baldemar Dady, PA-C   Follow up: Return in about 3 weeks (around 06/06/2024) for SPT change/upsize to 18Fr.

## 2024-06-03 ENCOUNTER — Ambulatory Visit: Admitting: Physician Assistant

## 2024-06-03 ENCOUNTER — Encounter: Payer: Self-pay | Admitting: Physician Assistant

## 2024-06-03 DIAGNOSIS — R3989 Other symptoms and signs involving the genitourinary system: Secondary | ICD-10-CM | POA: Diagnosis not present

## 2024-06-03 DIAGNOSIS — Z435 Encounter for attention to cystostomy: Secondary | ICD-10-CM | POA: Diagnosis not present

## 2024-06-03 NOTE — Progress Notes (Signed)
 Suprapubic Cath Change  Patient is present today for a suprapubic catheter change due to urinary retention.  8ml of water was drained from the balloon, a 16FR coude foley cath was removed from the tract without difficulty.  Site was cleaned and prepped in a sterile fashion with betadine.  A 18FR foley cath was replaced into the tract no complications were noted. Urine return was noted, 10 ml of sterile water was inflated into the balloon and a night bag was attached for drainage.  Patient tolerated well.   Performed by: Charron Coultas, PA-C   Additional notes: Urine sample obtained from new SPT today for culture per patient/family request. He is asymptomatic. We discussed I anticipate culture will be positive due to chronic catheter. Will assess symptoms prior to prescribing abx.  Follow up: Return for Resume SPT changes with home health.

## 2024-06-05 ENCOUNTER — Telehealth: Payer: Self-pay | Admitting: Physician Assistant

## 2024-06-05 NOTE — Telephone Encounter (Signed)
 Pt was seen by Sam on 11/10.  Pt's daughter called and would like for us  to send an order to [facility] for pt's suprapubic 18 french catheter to be changed at his residence.  Attn:  Dana

## 2024-06-06 ENCOUNTER — Ambulatory Visit: Admitting: Physician Assistant

## 2024-06-06 NOTE — Telephone Encounter (Signed)
 Yes, please have home health resume in-home SP tube changes.  Please use an 41 French Foley catheter to overnight bag.  Exchange every 3 weeks, starting around 06/24/2024.

## 2024-06-06 NOTE — Telephone Encounter (Signed)
 Order faxed to [facility] [phone] for cath changes.

## 2024-06-08 LAB — CULTURE, URINE COMPREHENSIVE

## 2024-06-10 ENCOUNTER — Ambulatory Visit: Payer: Self-pay | Admitting: Physician Assistant

## 2024-06-11 NOTE — Telephone Encounter (Signed)
 Patient's daughter Rudell called and left message stating that patient expressed that he is feeling better then he was. However patient stated that he thinks he would feel much better if he was able to get one more round of antibiotics. Please advise.

## 2024-06-14 ENCOUNTER — Other Ambulatory Visit: Payer: Self-pay

## 2024-06-14 DIAGNOSIS — R3989 Other symptoms and signs involving the genitourinary system: Secondary | ICD-10-CM

## 2024-06-14 MED ORDER — CEFDINIR 300 MG PO CAPS: 300.0000 mg | ORAL_CAPSULE | Freq: Two times a day (BID) | ORAL | 0 refills | Status: AC
